# Patient Record
Sex: Female | Born: 1995 | Race: Black or African American | Hispanic: No | Marital: Single | Smoking: Never smoker
Health system: Southern US, Community
[De-identification: ages and names within clinical notes are randomized; demographics above are authoritative.]

## PROBLEM LIST (undated history)

## (undated) DIAGNOSIS — F259 Schizoaffective disorder, unspecified: Secondary | ICD-10-CM

---
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
## 2015-12-22 ENCOUNTER — Emergency Department (HOSPITAL_COMMUNITY)
Admission: EM | Admit: 2015-12-22 | Discharge: 2015-12-24 | Disposition: A | Discharge status: Other Acute Inpatient Hospital | Payer: MEDICAID | Attending: Emergency Medicine | Admitting: Emergency Medicine

## 2015-12-22 ENCOUNTER — Encounter (HOSPITAL_COMMUNITY): Payer: Self-pay | Admitting: Emergency Medicine

## 2015-12-22 DIAGNOSIS — F251 Schizoaffective disorder, depressive type: Secondary | ICD-10-CM | POA: Diagnosis present

## 2015-12-22 DIAGNOSIS — Z3202 Encounter for pregnancy test, result negative: Secondary | ICD-10-CM | POA: Insufficient documentation

## 2015-12-22 DIAGNOSIS — Z046 Encounter for general psychiatric examination, requested by authority: Secondary | ICD-10-CM | POA: Diagnosis present

## 2015-12-22 DIAGNOSIS — R45850 Homicidal ideations: Secondary | ICD-10-CM | POA: Diagnosis not present

## 2015-12-22 HISTORY — DX: Schizoaffective disorder, unspecified: F25.9

## 2015-12-22 LAB — COMPREHENSIVE METABOLIC PANEL
ALBUMIN: 3.6 g/dL (ref 3.5–5.0)
ALT: 23 U/L (ref 14–54)
AST: 18 U/L (ref 15–41)
Alkaline Phosphatase: 55 U/L (ref 38–126)
Anion gap: 8 (ref 5–15)
BUN: 9 mg/dL (ref 6–20)
CHLORIDE: 109 mmol/L (ref 101–111)
CO2: 25 mmol/L (ref 22–32)
CREATININE: 0.74 mg/dL (ref 0.44–1.00)
Calcium: 8.6 mg/dL — ABNORMAL LOW (ref 8.9–10.3)
GFR calc Af Amer: >60 mL/min (ref >60)
GLUCOSE: 123 mg/dL — AB (ref 65–99)
Potassium: 4 mmol/L (ref 3.5–5.1)
Sodium: 142 mmol/L (ref 135–145)
Total Bilirubin: 0.3 mg/dL (ref 0.3–1.2)
Total Protein: 6.9 g/dL (ref 6.5–8.1)

## 2015-12-22 LAB — CBC
HCT: 32.9 % — ABNORMAL LOW (ref 36.0–46.0)
HEMOGLOBIN: 10.3 g/dL — AB (ref 12.0–15.0)
MCH: 24.5 pg — AB (ref 26.0–34.0)
MCHC: 31.3 g/dL (ref 30.0–36.0)
MCV: 78.1 fL (ref 78.0–100.0)
Platelets: 253 10*3/uL (ref 150–400)
RBC: 4.21 MIL/uL (ref 3.87–5.11)
RDW: 16.7 % — ABNORMAL HIGH (ref 11.5–15.5)
WBC: 6.3 10*3/uL (ref 4.0–10.5)

## 2015-12-22 LAB — ETHANOL

## 2015-12-22 NOTE — ED Notes (Signed)
Pt states she had unprotected sex and now has 2 open sores in her genital area. She would like this assessed while she is here

## 2015-12-22 NOTE — ED Provider Notes (Signed)
CSN: [id number]     Arrival date & time 12/22/15  2137 History   First MD Initiated Contact with Patient 12/22/15 2246     Chief Complaint  Patient presents with  . Medical Clearance     The history is provided by the patient. No language interpreter was used.   Evelyn Alvarez is a [age] y.o. female who presents to the Emergency Department complaining of psych eval. She has a hx/o schizoaffective disorder and has been off of her medications for the last five days.  She is worried that she will hit people because she has an overwhelming urge to hip people.  She denies SI/HI.  She has poor family support.  Symptoms are moderate and constant in nature.  Past Medical History  Diagnosis Date  . Schizoaffective disorder (HCC)    History reviewed. No pertinent past surgical history. No family history on file. Social History  Substance Use Topics  . Smoking status: Never Smoker   . Smokeless tobacco: None  . Alcohol Use: No   OB History    No data available     Review of Systems  All other systems reviewed and are negative.     Allergies  Review of patient's allergies indicates no known allergies.  Home Medications   Prior to Admission medications   Not on File   BP 108/63 mmHg  Pulse 98  Temp(Src) 97.6 F (36.4 C) (Oral)  Resp 16  Ht 5\' 6"  (1.676 m)  Wt 230 lb (104.327 kg)  BMI 37.14 kg/m2  SpO2 99% Physical Exam  Constitutional: She is oriented to person, place, and time. She appears well-developed and well-nourished.  HENT:  Head: Normocephalic and atraumatic.  Cardiovascular: Normal rate and regular rhythm.   Pulmonary/Chest: Effort normal. No respiratory distress.  Musculoskeletal: Normal range of motion.  Neurological: She is alert and oriented to person, place, and time.  Skin: Skin is warm.  Psychiatric:  Flat affect  Nursing note and vitals reviewed.   ED Course  Procedures (including critical care time) Labs Review Labs Reviewed  CBC - Abnormal;  Notable for the following:    Hemoglobin 10.3 (*)    HCT 32.9 (*)    MCH 24.5 (*)    RDW 16.7 (*)    All other components within normal limits  COMPREHENSIVE METABOLIC PANEL - Abnormal; Notable for the following:    Glucose, Bld 123 (*)    Calcium 8.6 (*)    All other components within normal limits  ETHANOL  URINE RAPID DRUG SCREEN, HOSP PERFORMED  POC URINE PREG, ED    Imaging Review No results found. I have personally reviewed and evaluated these images and lab results as part of my medical decision-making.   EKG Interpretation None      MDM   Final diagnoses:  None    Patient with history of schizoaffective disorder presents to the emergency Department for feeling off due to being off of her medications. She states she [facility]. And has been in [place] previously. She is currently calm and appropriate in the emergency Department. Unable to access any prior psychiatric records. TTS consulted. She is medically clear.    Tilden FossaElizabeth Jezebelle Ledwell, MD 12/22/15 [phone]
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
## 2015-12-23 DIAGNOSIS — F251 Schizoaffective disorder, depressive type: Secondary | ICD-10-CM

## 2015-12-23 LAB — RAPID URINE DRUG SCREEN, HOSP PERFORMED
AMPHETAMINES: NOT DETECTED
BARBITURATES: NOT DETECTED
Benzodiazepines: NOT DETECTED
Cocaine: NOT DETECTED
OPIATES: NOT DETECTED
TETRAHYDROCANNABINOL: NOT DETECTED

## 2015-12-23 LAB — POC URINE PREG, ED: PREG TEST UR: NEGATIVE

## 2015-12-23 MED ORDER — HYDROXYZINE HCL 25 MG PO TABS
25.0000 mg | ORAL_TABLET | Freq: Two times a day (BID) | ORAL | Status: DC
  Administered 2015-12-23 – 2015-12-24 (×2): 25 mg via ORAL
  Filled 2015-12-23 (×2): qty 1

## 2015-12-23 MED ORDER — LURASIDONE HCL 40 MG PO TABS
40.0000 mg | ORAL_TABLET | Freq: Every day | ORAL | Status: DC
  Administered 2015-12-24: 40 mg via ORAL
  Filled 2015-12-23 (×2): qty 1

## 2015-12-23 MED ORDER — TRAZODONE HCL 100 MG PO TABS
100.0000 mg | ORAL_TABLET | Freq: Every day | ORAL | Status: DC
  Administered 2015-12-23: 100 mg via ORAL
  Filled 2015-12-23: qty 1

## 2015-12-23 NOTE — BH Assessment (Signed)
Writer faxed referrals to the following hospitals: [facility], [facility], [facility], [facility], [facility], [facility], [facility],  [facility], [facility].   

## 2015-12-23 NOTE — BH Assessment (Signed)
Assessment completed. Consulted Alberteen SamFran Hobson, NP who recommended inpatient treatment. TTS to seek placement. Informed Dr. Madilyn Hookees of the recommendation.

## 2015-12-23 NOTE — BH Assessment (Signed)
Dr. Jannifer FranklinAkintayo recommends speaking with patients aunt tomorrow for collateral information . Evelyn GrebeNatalie [phone]. Patient states that if she is discharged she is able to live with her aunt. This needs to be confirmed before patient is discharged and to obtain collateral information.   [name], LCSW Therapeutic Triage Specialist [facility] 12/23/2015 4:35 PM

## 2015-12-23 NOTE — Consult Note (Signed)
[place] Psychiatry Consult   Reason for Consult:  Paranoia Referring Physician:  EDP Patient Identification: Evelyn Alvarez MRN:  [id number] Principal Diagnosis: Schizoaffective disorder, depressive type [facility]) Diagnosis:   Patient Active Problem List   Diagnosis Date Noted  . Schizoaffective disorder, depressive type ([place]) [F25.1] 12/23/2015    Priority: High    Total Time spent with patient: 45 minutes  Subjective:   Evelyn Alvarez is a 20 y.o. female patient admitted with  Paranoia  HPI:  AA female, 20 years old was evaluated for Paranoia.  Patient reports that she always believed that somebody was after her.  She reports felling anxious and and nervious for no reasons.  She recently moved out of her mother's house and has been homeless because she want to build her Character  She also reports  That she moved out of the house because the relationship between her and her mother is poor.  Patient reports a diagnosis of Schizoaffective disorder and that she was on medications she did not think were effective.  She however ran out of her medications and have not been taking any.  She was recently hospitalized at [facility] health stabilization.  She denies feeling sad or depress and reports that her mother has anger problem.  She has not been sleeping or eating well since she left her mother's home.  She plans to move in with her Aunt after her treatment.  She denies SI/HI/AVH.  She is accepted for admission and we will be seeking placement at any hospital with available bed.  Past Psychiatric History:  Schizoaffective disorder, Depressed type  Risk to Self: Suicidal Ideation: No Suicidal Intent: No Is patient at risk for suicide?: No Suicidal Plan?: No Access to Means: No What has been your use of drugs/alcohol within the last 12 months?: Pt denies  How many times?: 0 Other Self Harm Risks: Pt denies  Triggers for Past Attempts: None known (No previous attempts  reported. ) Intentional Self Injurious Behavior: None Risk to Others: Homicidal Ideation: No Thoughts of Harm to Others: No Current Homicidal Intent: No Current Homicidal Plan: No Access to Homicidal Means: No Identified Victim: N/A History of harm to others?: No Assessment of Violence: None Noted Violent Behavior Description: No violent behaviors observed.  Does patient have access to weapons?: No Criminal Charges Pending?: No Does patient have a court date: No Prior Inpatient Therapy: Prior Inpatient Therapy: Yes Prior Therapy Dates: 2016, 2017 Prior Therapy Facilty/Provider(s): [facility], [facility], [facility]  Reason for Treatment: Schizoaffective Disorder  Prior Outpatient Therapy: Prior Outpatient Therapy: Yes Prior Therapy Dates: Current  Prior Therapy Facilty/Provider(s): [facility]  Reason for Treatment: Medication management  Does patient have an ACCT team?: No Does patient have Intensive In-House Services?  : No Does patient have [facility] services? : Yes Does patient have P4CC services?: No  Past Medical History:  Past Medical History  Diagnosis Date  . Schizoaffective disorder ([place])    History reviewed. No pertinent past surgical history. Family History: No family history on file.   Family Psychiatric  History:  Denies Social History:  History  Alcohol Use No     History  Drug Use Not on file    Social History   Social History  . Marital Status: Single    Spouse Name: N/A  . Number of Children: N/A  . Years of Education: N/A   Social History Main Topics  . Smoking status: Never Smoker   . Smokeless tobacco: None  .  Alcohol Use: No  . Drug Use: None  . Sexual Activity: Not Asked   Other Topics Concern  . None   Social History Narrative  . None   Additional Social History:    Allergies:  No Known Allergies  Labs:  Results for orders placed or performed during the hospital encounter of 12/22/15 (from the past 48 hour(s))  CBC     Status: Abnormal    Collection Time: 12/22/15 10:43 PM  Result Value Ref Range   WBC 6.3 4.0 - 10.5 K/uL   RBC 4.21 3.87 - 5.11 MIL/uL   Hemoglobin 10.3 (L) 12.0 - 15.0 g/dL   HCT 32.9 (L) 36.0 - 46.0 %   MCV 78.1 78.0 - 100.0 fL   MCH 24.5 (L) 26.0 - 34.0 pg   MCHC 31.3 30.0 - 36.0 g/dL   RDW 16.7 (H) 11.5 - 15.5 %   Platelets 253 150 - 400 K/uL  Ethanol (ETOH)     Status: None   Collection Time: 12/22/15 10:43 PM  Result Value Ref Range   Alcohol, Ethyl (B) <5 <5 mg/dL    Comment:        LOWEST DETECTABLE LIMIT FOR SERUM ALCOHOL IS 5 mg/dL FOR MEDICAL PURPOSES ONLY   Comprehensive metabolic panel     Status: Abnormal   Collection Time: 12/22/15 10:43 PM  Result Value Ref Range   Sodium 142 135 - 145 mmol/L   Potassium 4.0 3.5 - 5.1 mmol/L   Chloride 109 101 - 111 mmol/L   CO2 25 22 - 32 mmol/L   Glucose, Bld 123 (H) 65 - 99 mg/dL   BUN 9 6 - 20 mg/dL   Creatinine, Ser 0.74 0.44 - 1.00 mg/dL   Calcium 8.6 (L) 8.9 - 10.3 mg/dL   Total Protein 6.9 6.5 - 8.1 g/dL   Albumin 3.6 3.5 - 5.0 g/dL   AST 18 15 - 41 U/L   ALT 23 14 - 54 U/L   Alkaline Phosphatase 55 38 - 126 U/L   Total Bilirubin 0.3 0.3 - 1.2 mg/dL   GFR calc non Af Amer >60 >60 mL/min   GFR calc Af Amer >60 >60 mL/min    Comment: (NOTE) The eGFR has been calculated using the CKD EPI equation. This calculation has not been validated in all clinical situations. eGFR's persistently <60 mL/min signify possible Chronic Kidney Disease.    Anion gap 8 5 - 15  Urine rapid drug screen (hosp performed) (Not at [facility])     Status: None   Collection Time: 12/23/15  5:15 AM  Result Value Ref Range   Opiates NONE DETECTED NONE DETECTED   Cocaine NONE DETECTED NONE DETECTED   Benzodiazepines NONE DETECTED NONE DETECTED   Amphetamines NONE DETECTED NONE DETECTED   Tetrahydrocannabinol NONE DETECTED NONE DETECTED   Barbiturates NONE DETECTED NONE DETECTED    Comment:        DRUG SCREEN FOR MEDICAL PURPOSES ONLY.  IF CONFIRMATION IS  NEEDED FOR ANY PURPOSE, NOTIFY LAB WITHIN 5 DAYS.        LOWEST DETECTABLE LIMITS FOR URINE DRUG SCREEN Drug Class       Cutoff (ng/mL) Amphetamine      1000 Barbiturate      200 Benzodiazepine   150 Tricyclics       569 Opiates          300 Cocaine          300 THC  50   POC urine preg, ED     Status: None   Collection Time: 12/23/15  5:22 AM  Result Value Ref Range   Preg Test, Ur NEGATIVE NEGATIVE    Comment:        THE SENSITIVITY OF THIS METHODOLOGY IS >24 mIU/mL     Current Facility-Administered Medications  Medication Dose Route Frequency Provider Last Rate Last Dose  . hydrOXYzine (ATARAX/VISTARIL) tablet 25 mg  25 mg Oral BID PC Adina Puzzo, MD      . Derrill Memo ON 12/24/2015] lurasidone (LATUDA) tablet 40 mg  40 mg Oral Q breakfast Hermila Millis, MD      . traZODone (DESYREL) tablet 100 mg  100 mg Oral QHS Corena Pilgrim, MD       Current Outpatient Prescriptions  Medication Sig Dispense Refill  . benztropine (COGENTIN) 1 MG tablet Take 1 mg by mouth 2 (two) times daily.    . paliperidone (INVEGA) 6 MG 24 hr tablet Take 6 mg by mouth daily.    . Paliperidone Palmitate (INVEGA SUSTENNA) 117 MG/0.75ML SUSP Inject 117 mg into the muscle every 30 (thirty) days.      Musculoskeletal: Strength & Muscle Tone: within normal limits Gait & Station: normal Patient leans: N/A  Psychiatric Specialty Exam: Review of Systems  Constitutional: Negative.   HENT: Negative.   Eyes: Negative.   Respiratory: Negative.   Cardiovascular: Negative.   Gastrointestinal: Negative.   Genitourinary: Negative.   Musculoskeletal: Negative.   Skin: Negative.   Neurological: Negative.   Endo/Heme/Allergies: Negative.     Blood pressure 118/72, pulse 95, temperature 97.8 F (36.6 C), temperature source Oral, resp. rate 18, height 5' 6"  (1.676 m), weight 104.327 kg (230 lb), SpO2 100 %.Body mass index is 37.14 kg/(m^2).  General Appearance: Casual and Fairly Groomed   Engineer, water::  Good  Speech:  Clear and Coherent and Normal Rate  Volume:  Normal  Mood:  NA  Affect:  Congruent and Depressed  Thought Process:  Coherent, Goal Directed and Intact  Orientation:  Full (Time, Place, and Person)  Thought Content:  WDL  Suicidal Thoughts:  No  Homicidal Thoughts:  No  Memory:  Immediate;   Good Recent;   Good Remote;   Good  Judgement:  Poor  Insight:  Shallow  Psychomotor Activity:  Normal  Concentration:  Fair  Recall:  NA  Fund of Knowledge:Poor  Language: Good  Akathisia:  No  Handed:  Right  AIMS (if indicated):     Assets:  Desire for Improvement Housing  ADL's:  Intact  Cognition: WNL  Sleep:      Treatment Plan Summary: Daily contact with patient to assess and evaluate symptoms and progress in treatment and Medication management  Disposition:   Accepted for admission and we will be seeking placement at any facility with available bed.  We will offer Hydroxyzine 25 mg po every 6 hours as needed for anxiety.  Latuda 40 mg po with breakfast for mood control and Trazodone 100 mg po QHS for sleep.  Delfin Gant, NP   PMHNP-BC 12/23/2015 4:31 PM Patient seen face-to-face for psychiatric evaluation, chart reviewed and case discussed with the physician extender and developed treatment plan. Reviewed the information documented and agree with the treatment plan. Corena Pilgrim, MD

## 2015-12-24 ENCOUNTER — Encounter (HOSPITAL_COMMUNITY): Payer: Self-pay

## 2015-12-24 ENCOUNTER — Inpatient Hospital Stay (HOSPITAL_COMMUNITY)
Admission: AD | Admit: 2015-12-24 | Discharge: 2016-01-01 | DRG: 885 | Disposition: A | Discharge status: Home / Self Care | Payer: MEDICAID | Source: Intra-hospital | Attending: Psychiatry | Admitting: Psychiatry

## 2015-12-24 DIAGNOSIS — F251 Schizoaffective disorder, depressive type: Secondary | ICD-10-CM | POA: Diagnosis present

## 2015-12-24 DIAGNOSIS — R45850 Homicidal ideations: Secondary | ICD-10-CM | POA: Diagnosis not present

## 2015-12-24 DIAGNOSIS — E221 Hyperprolactinemia: Secondary | ICD-10-CM | POA: Clinically undetermined

## 2015-12-24 DIAGNOSIS — Z833 Family history of diabetes mellitus: Secondary | ICD-10-CM | POA: Diagnosis not present

## 2015-12-24 DIAGNOSIS — Z590 Homelessness: Secondary | ICD-10-CM | POA: Diagnosis not present

## 2015-12-24 DIAGNOSIS — G4700 Insomnia, unspecified: Secondary | ICD-10-CM | POA: Diagnosis present

## 2015-12-24 DIAGNOSIS — F411 Generalized anxiety disorder: Secondary | ICD-10-CM | POA: Diagnosis present

## 2015-12-24 DIAGNOSIS — F250 Schizoaffective disorder, bipolar type: Secondary | ICD-10-CM | POA: Diagnosis present

## 2015-12-24 MED ORDER — MENTHOL 3 MG MT LOZG: 1.0000 | LOZENGE | OROMUCOSAL | Status: DC | PRN

## 2015-12-24 MED ORDER — MAGNESIUM HYDROXIDE 400 MG/5ML PO SUSP
30.0000 mL | Freq: Every day | ORAL | Status: DC | PRN
  Administered 2015-12-25: 30 mL via ORAL
  Filled 2015-12-24: qty 30

## 2015-12-24 MED ORDER — MENTHOL 3 MG MT LOZG
1.0000 | LOZENGE | OROMUCOSAL | Status: DC | PRN
  Administered 2015-12-24: 3 mg via ORAL
  Filled 2015-12-24: qty 9

## 2015-12-24 MED ORDER — HYDROXYZINE HCL 25 MG PO TABS
25.0000 mg | ORAL_TABLET | Freq: Two times a day (BID) | ORAL | Status: DC
  Administered 2015-12-25: 25 mg via ORAL
  Filled 2015-12-24 (×7): qty 1

## 2015-12-24 MED ORDER — TRAZODONE HCL 100 MG PO TABS
100.0000 mg | ORAL_TABLET | Freq: Every day | ORAL | Status: DC
  Administered 2015-12-25 – 2015-12-26 (×2): 100 mg via ORAL
  Filled 2015-12-24 (×5): qty 1

## 2015-12-24 MED ORDER — ACETAMINOPHEN 325 MG PO TABS: 650.0000 mg | ORAL_TABLET | Freq: Four times a day (QID) | ORAL | Status: DC | PRN

## 2015-12-24 MED ORDER — ALUM & MAG HYDROXIDE-SIMETH 200-200-20 MG/5ML PO SUSP: 30.0000 mL | ORAL | Status: DC | PRN

## 2015-12-24 MED ORDER — LURASIDONE HCL 40 MG PO TABS
40.0000 mg | ORAL_TABLET | Freq: Every day | ORAL | Status: DC
  Administered 2015-12-25: 40 mg via ORAL
  Filled 2015-12-24 (×4): qty 1

## 2015-12-24 NOTE — Progress Notes (Signed)
CSW was notified by Nurse that patient would like to speak with CSW.  CSW met with patient at bedside. Patient was alert and oriented. Patient informed CSW that she is homeless, and has been staying at [facility]. Patient states she is interested in finding a new place to live.  Patient is aware that she will be transferred to [facility]. CSW provided patient with community resources which consisted of a list of homeless shelters and food pantries. Patient informed CSW that she receives food stamps to assist her with food resources.  Patient states that it is an option for her to live with dad upon discharge. Patient states that her dad lives in [place] and that he is her primary support.   Willette Brace [phone] ED CSW 12/24/2015 5:17 PM

## 2015-12-24 NOTE — Tx Team (Signed)
Initial Interdisciplinary Treatment Plan   PATIENT STRESSORS: Medication change or noncompliance Homeless   PATIENT STRENGTHS: Communication skills Supportive family/friends   PROBLEM LIST: Problem List/Patient Goals Date to be addressed Date deferred Reason deferred Estimated date of resolution  Psychosis-Negative thoughts 12/24/15     Anxiety 12/24/15     "Learn coping skills for PTSD and negative thinking" 12/24/15     "I want to e able to remind myself that I'm not in danger" 12/24/15                                    DISCHARGE CRITERIA:  Need for constant or close observation no longer present Verbal commitment to aftercare and medication compliance  PRELIMINARY DISCHARGE PLAN: Outpatient therapy Medication management  PATIENT/FAMIILY INVOLVEMENT: This treatment plan has been presented to and reviewed with the patient, Evelyn Alvarez.  The patient and family have been given the opportunity to ask questions and make suggestions.  Norm ParcelHeather V Dale Ribeiro 12/24/2015, 10:45 PM

## 2015-12-24 NOTE — ED Notes (Addendum)
Pt is very cooperative but requests food all the time. Pt denies SI and HI and contracts for safety. Pt has been requesting snacks all day long. (12noon ) 3pm Pt is asleep but easily awakens. Pt remains pleasant and cooperative. Report to Jan , Charity fundraiserN. [facility]. Report to the new shift. (3:20pm )

## 2015-12-24 NOTE — Progress Notes (Signed)
Did not attend group 

## 2015-12-24 NOTE — Progress Notes (Addendum)
Evelyn Alvarez is a 20 year old female admitted to room 503-1 from [facility] due to complaints of "negative thoughts and PTSD."  She was recently d/c from [place] a few days ago but didn't start her medications when she was discharged.  She is currently homeless and she isn't sure where she is going to go upon discharge.  She denies SI/HI or A/V hallucinations at this time.  Admission paperwork completed and signed.  Skin assessment completed and no issues noted.  Belonging searched and secured in locker # 40-brown purse, blue coat, gray shoes, plastic bag with papers and black notebook.  Oriented her to the unit.  Q 15 minute checks initiated for safety.  We will monitor the progress towards her goals.

## 2015-12-24 NOTE — ED Notes (Signed)
Pt is awake and alert and just took a shower on the unit. Clean scrubs provided per pt request.  She is exhibiting no signs of distress and is currently on the phone with a family member.

## 2015-12-24 NOTE — BH Assessment (Signed)
[facility] Assessment Progress Note  Per Thedore MinsMojeed Akintayo, MD, this pt requires psychiatric hospitalization at this time.  Evelyn Heinrichina Tate, RN, [facility].  Pt has signed Voluntary Admission and Consent for Treatment, as well as Consent to Release Information to [facility], her outpatient provider, and a notification call has been placed.  Signed forms [facility].  Pt's nurse, Marylu LundJanet, has been notified, and agrees to send original paperwork along with pt via [place] [place], and to call report to [phone].  Doylene Canninghomas Ambur Province, MA Triage Specialist [phone]

## 2015-12-24 NOTE — Progress Notes (Addendum)
Reviewed EPIC medicaid response hx  [facility] to confirm pt has not been seen in this office but the office is accepting new patients  [place]   Entered in d/c instructions  [facility] Schedule an appointment as soon as possible for a visit This is your assigned medicaid doctor PLEASE call to establish care at this office They confirmed they are accepting new patients pcp is listed as Sandi Mariscalmedicaid [place] access provider [facility] [address] [place] [phone]  Medicaid [place] access patient You may also benefit from [place] transportation to your medical appointments [phone] If you plan to reside in [place]- please to DSS [address] [place] or call [phone] to updated your medicaid provider to a [place] provider
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
## 2015-12-24 NOTE — ED Notes (Addendum)
Pt woke up requesting supplies to clean herself up with since she was on her period. Pt also requesting cough drops.

## 2015-12-25 ENCOUNTER — Encounter (HOSPITAL_COMMUNITY): Payer: Self-pay | Admitting: Psychiatry

## 2015-12-25 DIAGNOSIS — F250 Schizoaffective disorder, bipolar type: Principal | ICD-10-CM

## 2015-12-25 DIAGNOSIS — R45850 Homicidal ideations: Secondary | ICD-10-CM

## 2015-12-25 MED ORDER — OLANZAPINE 10 MG PO TBDP: 10.0000 mg | ORAL_TABLET | Freq: Three times a day (TID) | ORAL | Status: DC | PRN

## 2015-12-25 MED ORDER — HYDROXYZINE HCL 25 MG PO TABS
25.0000 mg | ORAL_TABLET | Freq: Three times a day (TID) | ORAL | Status: DC | PRN
  Administered 2015-12-25 – 2015-12-28 (×2): 25 mg via ORAL
  Filled 2015-12-25 (×3): qty 1

## 2015-12-25 MED ORDER — OLANZAPINE 10 MG IM SOLR: 10.0000 mg | Freq: Three times a day (TID) | INTRAMUSCULAR | Status: DC | PRN

## 2015-12-25 NOTE — BHH Group Notes (Signed)
[facility] Group Notes:  (Nursing/MHT/Case Management/Adjunct)  Date:  12/25/2015  Time:  3:08 PM  Type of Therapy:  Psychoeducational Skills  Participation Level:  Active  Participation Quality:  Appropriate  Affect:  Appropriate  Cognitive:  Alert  Insight:  Appropriate  Engagement in Group:  Engaged and Supportive  Modes of Intervention:  Discussion    Summary of Progress/Problems:   Group was about coping skills and when pts. Have used them in the past to deal with situations.  Pt. Told about times when other people had made her angry and what coping skills she used to help deal with her anger.  Pt. Shows good participation and insight  Arsenio LoaderHiatt, Espiridion Supinski Dudley 12/25/2015, 3:08 PM

## 2015-12-25 NOTE — Progress Notes (Signed)
Adult Psychoeducational Group Note  Date:  12/25/2015 Time: 09:15am  Group Topic/Focus:  Recovery Goals:   The focus of this group is to identify appropriate goals for recovery and establish a plan to achieve them.  Participation Level:  Active  Participation Quality:  Appropriate  Affect:  Flat  Cognitive:  Alert and Disorganized  Insight: Improving  Engagement in Group:  Engaged and Improving  Modes of Intervention:  Activity, Discussion, Education and Support  Additional Comments:  Pt attended and actively participated in group. Pt reports that she would like to incorporate deep breathing as a coping skill into her recovery plan.  Aurora Maskwyman, Vihan Santagata E 12/25/2015, 11:43 AM

## 2015-12-25 NOTE — BHH Suicide Risk Assessment (Signed)
[facility] Admission Suicide Risk Assessment   Nursing information obtained from:    Demographic factors:    Current Mental Status:    Loss Factors:    Historical Factors:    Risk Reduction Factors:     Total Time spent with patient: 30 minutes Principal Problem: Schizoaffective disorder, bipolar type (HCC) Diagnosis:   Patient Active Problem List   Diagnosis Date Noted  . Schizoaffective disorder, bipolar type (HCC) [F25.0] 12/25/2015   Subjective Data: Patient states " I was at this program in [place] and I stopped talking to people , I also felt paranoid and so they send me to another hospital last week.I continue to feel paranoid and need to learn some coping techniques."    Continued Clinical Symptoms:  Alcohol Use Disorder Identification Test Final Score (AUDIT): 0 The "Alcohol Use Disorders Identification Test", Guidelines for Use in Primary Care, Second Edition.  World Science writer [facility]). Score between 0-7:  no or low risk or alcohol related problems. Score between 8-15:  moderate risk of alcohol related problems. Score between 16-19:  high risk of alcohol related problems. Score 20 or above:  warrants further diagnostic evaluation for alcohol dependence and treatment.   CLINICAL FACTORS:   Previous Psychiatric Diagnoses and Treatments   Musculoskeletal: Strength & Muscle Tone: within normal limits Gait & Station: normal Patient leans: N/A  Psychiatric Specialty Exam: Review of Systems  Psychiatric/Behavioral: The patient is nervous/anxious.   All other systems reviewed and are negative.   Blood pressure 120/53, pulse 103, temperature 98 F (36.7 C), temperature source Oral, resp. rate 20, height  (1.676 m), weight 114.08 kg (251 lb 8 oz), SpO2 100 %.Body mass index is 40.61 kg/(m^2).  General Appearance: Fairly Groomed  Patent attorney::  Fair  Speech:  Normal Rate  Volume:  Normal  Mood:  Anxious  Affect:  Congruent  Thought Process:  Goal Directed   Orientation:  Full (Time, Place, and Person)  Thought Content:  Paranoid Ideation and Rumination  Suicidal Thoughts:  No  Homicidal Thoughts:  No  Memory:  Immediate;   Fair Recent;   Fair Remote;   Fair  Judgement:  Impaired  Insight:  Shallow  Psychomotor Activity:  Normal  Concentration:  Fair  Recall:  [employer] of Knowledge:Fair  Language: Fair  Akathisia:  No  Handed:  Right  AIMS (if indicated):     Assets:  Desire for Improvement  Sleep:  Number of Hours: 6.75  Cognition: WNL  ADL's:  Intact    COGNITIVE FEATURES THAT CONTRIBUTE TO RISK:  Closed-mindedness, Polarized thinking and Thought constriction (tunnel vision)    SUICIDE RISK:   Moderate:  Frequent suicidal ideation with limited intensity, and duration, some specificity in terms of plans, no associated intent, good self-control, limited dysphoria/symptomatology, some risk factors present, and identifiable protective factors, including available and accessible social support.  PLAN OF CARE: Patient will benefit from inpatient treatment and stabilization.  Estimated length of stay is 5-7 days.  Reviewed past medical records,treatment plan.  Will continue Latuda 40 mg po daily for mood sx/psychosis. Will make available PRN medications as per agitation protocol. Will continue to monitor vitals ,medication compliance and treatment side effects while patient is here.  Will monitor for medical issues as well as call consult as needed.  Reviewed labs , cbc - low hb/hct - likely chronic , UDS- negative , BAL <5 , ekg - for qtc , will order tsh, lipid panel, hba1c, PL. CSW will start working  on disposition.  Patient to participate in therapeutic milieu .       I certify that inpatient services furnished can reasonably be expected to improve the patient's condition.   Jalacia Mattila, MD 12/25/2015, 1:55 PM
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
## 2015-12-25 NOTE — Progress Notes (Signed)
Pt at the time of assessment was in bed with eye closed Pt did not look to be in any distress. Support, encouragement, and safe environment provided.  15-minute safety checks continue. Meds not given as Pt was very difficult to arouse.

## 2015-12-25 NOTE — [facility] Group Notes (Signed)
[facility] LCSW Group Therapy  12/25/2015 , 1:34 PM   Type of Therapy:  Group Therapy  Participation Level:  Active  Participation Quality:  Attentive  Affect:  Appropriate  Cognitive:  Alert  Insight:  Improving  Engagement in Therapy:  Engaged  Modes of Intervention:  Discussion, Exploration and Socialization  Summary of Progress/Problems: Today's group focused on the term Diagnosis.  Participants were asked to define the term, and then pronounce whether it is a negative, positive or neutral term.  In and out a couple of times.  Reluctant group member.  "I'm afraid that I will say something that sounds stupid."  Others encouraged her, and promised no judgement.  This seemed to help.  "Mistakes=Protein."  Went on to explain that We need food for protein so that we can grow, just like we need mistakes for protein "so we can Alvarez from them."  Daryel GeraldNorth, Evelyn Alvarez B 12/25/2015 , 1:34 PM

## 2015-12-25 NOTE — Progress Notes (Signed)
Patient ID: Evelyn Alvarez, female   DOB: Dec 23, 1995, 20 y.o.   MRN: [id number]  Pt currently presents with a flat affect and guarded, anxious behavior. Per self inventory, pt rates depression at a 0, hopelessness 0 and anxiety 2. Pt's daily goal is to "my negative thoughts" and they intend to do so by "breath slowly." Pt reports good sleep, a good appetite, normal energy and good concentration. Pt reports that she does not want to "take too many medications" and asks writer to review them with her. Pt seen practicing yoga in the hallway with another pt.   Pt provided with medications per providers orders. Pt's labs and vitals were monitored throughout the day. Pt supported emotionally and encouraged to express concerns and questions. Pt educated on medications and the importance of adhering to medication regimen.   Pt's safety ensured with 15 minute and environmental checks. Pt currently experiencing some thought blocking, has trouble conveying what she is thinking with others. Pt currently denies SI/HI and A/V hallucinations. Pt verbally agrees to seek staff if SI/HI or A/VH occurs and to consult with staff before acting on these thoughts. Will continue POC.
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
## 2015-12-26 LAB — LIPID PANEL
CHOL/HDL RATIO: 3.2 ratio
CHOLESTEROL: 159 mg/dL (ref 0–200)
HDL: 50 mg/dL (ref >40)
LDL Cholesterol: 95 mg/dL (ref 0–99)
TRIGLYCERIDES: 68 mg/dL (ref <150)
VLDL: 14 mg/dL (ref 0–40)

## 2015-12-26 LAB — TSH: TSH: 2.596 u[IU]/mL (ref 0.350–4.500)

## 2015-12-26 MED ORDER — BENZTROPINE MESYLATE 0.5 MG PO TABS
0.5000 mg | ORAL_TABLET | Freq: Every day | ORAL | Status: DC
  Administered 2015-12-29 – 2015-12-31 (×3): 0.5 mg via ORAL
  Filled 2015-12-26 (×7): qty 1

## 2015-12-26 MED ORDER — PALIPERIDONE ER 3 MG PO TB24
3.0000 mg | ORAL_TABLET | Freq: Every day | ORAL | Status: DC
  Filled 2015-12-26: qty 1

## 2015-12-26 MED ORDER — PALIPERIDONE ER 3 MG PO TB24: 3.0000 mg | ORAL_TABLET | ORAL | Status: DC

## 2015-12-26 MED ORDER — PALIPERIDONE ER 6 MG PO TB24
6.0000 mg | ORAL_TABLET | Freq: Every day | ORAL | Status: DC
  Filled 2015-12-26 (×2): qty 1

## 2015-12-26 MED ORDER — PALIPERIDONE ER 3 MG PO TB24
3.0000 mg | ORAL_TABLET | Freq: Every day | ORAL | Status: DC
  Administered 2015-12-26: 3 mg via ORAL
  Filled 2015-12-26 (×3): qty 1

## 2015-12-26 MED ORDER — PALIPERIDONE PALMITATE 156 MG/ML IM SUSP: 117.0000 mg | INTRAMUSCULAR | Status: DC

## 2015-12-26 NOTE — Progress Notes (Signed)
D. Pt had been up and visible in milieu this evening, seen interacting appropriately with peers in milieu. Pt spoke about her day and pt spoke about how she has been feeling paranoid. Pt reports that she feels her medications help with this but spoke about needing to find ways to not feel so paranoid. Pt was able to receive all medications without incident this evening and did not verbalize any complaints of pain. A. Support and encouragement provided, medication education given. R. Pt verbalized understanding, safety maintained.

## 2015-12-26 NOTE — [facility] Group Notes (Signed)
[facility] LCSW Group Therapy  12/26/2015 1:31 PM  Type of Therapy: Group Therapy  Participation Level: Active  Participation Quality: Attentive  Affect: Flat  Cognitive: Oriented  Insight: Limited  Engagement in Therapy: Engaged  Modes of Intervention: Discussion and Socialization  Summary of Progress/Problems: Evelyn Alvarez from [facility] [employer] was here to tell his story of recovery and play his guitar.  Pt was present and alert for the duration of group. Seemed to be responding to internal stimuli as evidenced by smiling to herself and thought blocking. Mentioned to speaker that he wasn't giving enough eye contact and that was making her feel excluded "zoned out" from the group. Later left group because she continued to feel as if the speaker wasn't giving enough eye contact.  Evelyn Alvarez 12/26/2015 1:31 PM

## 2015-12-26 NOTE — Progress Notes (Signed)
DAR NOTE: Patient is calm and pleasant.  Mood and affect is bright.  Denies pain, auditory and visual hallucinations.  Rates depression at 0, hopelessness at 0, and anxiety at 2.  Maintained on routine safety checks.  Medications given as prescribed.  Support and encouragement offered as needed.  Attended group and participated.  States goal for today is "compassion."  Patient observed socializing with peers in the dayroom.  Offered no complaint.

## 2015-12-26 NOTE — Progress Notes (Signed)
Patient ID: Evelyn Alvarez, female   DOB: September 02, 1996, 20 y.o.   MRN: [id number] D: Client visible on the unit, in dayroom watching TV. Client reports "really good day" "everything been well" "learn self-control" "breathing" "saying I'm in control" A: Writer provided emotional support encouraged client to continue to practice coping skills. Medications reviewed and administered as ordered. Staff will monitor q1815min for safety. R: client is safe on the unit, attended group.

## 2015-12-26 NOTE — Progress Notes (Addendum)
[facility] MD Progress Note  12/26/2015 1:28 PM Evelyn Alvarez  MRN:  [id number] Subjective: Patient states " I feel less anxious today.'  Objective:Quantina Cadden is a 20 y.o. AA female, single , unemployed , has a  hx of schizoaffective disorder ,  patient admitted with paranoia and agitation . Patient seen and chart reviewed.Discussed patient with treatment team.  Pt today continues to have thought blocking as well as paranoia. Per staff patient continues to discuss topics with delusional content. Pt seen as vaguely irritable at times. Pt has been trying to cope with anxiety by doing yoga in the hallway. Pt has been refusing the Latuda, reports she does not feel good on it. Pt per EHR was on Invega sustenna IM - she reports that she received 2 does of Invega, the week of March 12 th.  CSW to obtain medical records from [facility] . Pt agrees to be restarted on Invega PO until we can review her records.   Principal Problem: Schizoaffective disorder, bipolar type (HCC) Diagnosis:   Patient Active Problem List   Diagnosis Date Noted  . Schizoaffective disorder, bipolar type (HCC) [F25.0] 12/25/2015   Total Time spent with patient: 30 minutes  Past Psychiatric History: please see H&p  Past Medical History: Denies hx of HTN, thyroid disease,asthma Past Medical History  Diagnosis Date  . Schizoaffective disorder (HCC)    History reviewed. No pertinent past surgical history. Family History:  Family History  Problem Relation Age of Onset  . Mental illness Neg Hx   . Diabetes Maternal Grandmother   . Alcoholism Maternal Grandmother    Family Psychiatric  History: Pt reports her mother has anger management issues. Her grand mother used to be an alcoholic. Social History: Pt is single , was living at a program at [place] , but currently reports she is homeless.  History  Alcohol Use No     History  Drug Use Not on file    Social History   Social History  . Marital Status: Single     Spouse Name: N/A  . Number of Children: N/A  . Years of Education: N/A   Social History Main Topics  . Smoking status: Never Smoker   . Smokeless tobacco: None  . Alcohol Use: No  . Drug Use: None  . Sexual Activity: Not Asked   Other Topics Concern  . None   Social History Narrative   Additional Social History:                         Sleep: Fair  Appetite:  Fair  Current Medications: Current Facility-Administered Medications  Medication Dose Route Frequency Provider Last Rate Last Dose  . acetaminophen (TYLENOL) tablet 650 mg  650 mg Oral Q6H PRN Charm Rings, NP      . alum & mag hydroxide-simeth (MAALOX/MYLANTA) 200-200-20 MG/5ML suspension 30 mL  30 mL Oral Q4H PRN Charm Rings, NP      . hydrOXYzine (ATARAX/VISTARIL) tablet 25 mg  25 mg Oral TID PRN Jomarie Longs, MD   25 mg at 12/25/15 2045  . magnesium hydroxide (MILK OF MAGNESIA) suspension 30 mL  30 mL Oral Daily PRN Charm Rings, NP   30 mL at 12/25/15 1048  . menthol-cetylpyridinium (CEPACOL) lozenge 3 mg  1 lozenge Oral PRN Charm Rings, NP      . OLANZapine zydis (ZYPREXA) disintegrating tablet 10 mg  10 mg Oral TID PRN Jomarie Longs, MD  Or  . OLANZapine (ZYPREXA) injection 10 mg  10 mg Intramuscular TID PRN Jomarie Longs, MD      . Melene Muller ON 12/27/2015] paliperidone (INVEGA) 24 hr tablet 3 mg  3 mg Oral QHS Verna Desrocher, MD      . traZODone (DESYREL) tablet 100 mg  100 mg Oral QHS Charm Rings, NP   100 mg at 12/25/15 2045    Lab Results:  Results for orders placed or performed during the hospital encounter of 12/24/15 (from the past 48 hour(s))  TSH     Status: None   Collection Time: 12/26/15  6:20 AM  Result Value Ref Range   TSH 2.596 0.350 - 4.500 uIU/mL    Comment: Performed at [facility]  Lipid panel     Status: None   Collection Time: 12/26/15  6:20 AM  Result Value Ref Range   Cholesterol 159 0 - 200 mg/dL   Triglycerides 68 <161 mg/dL    HDL 50 >09 mg/dL   Total CHOL/HDL Ratio 3.2 RATIO   VLDL 14 0 - 40 mg/dL   LDL Cholesterol 95 0 - 99 mg/dL    Comment:        Total Cholesterol/HDL:CHD Risk Coronary Heart Disease Risk Table                     Men   Women  1/2 Average Risk   3.4   3.3  Average Risk       5.0   4.4  2 X Average Risk   9.6   7.1  3 X Average Risk  23.4   11.0        Use the calculated Patient Ratio above and the CHD Risk Table to determine the patient's CHD Risk.        ATP III CLASSIFICATION (LDL):  <100     mg/dL   Optimal  604-540  mg/dL   Near or Above                    Optimal  130-159  mg/dL   Borderline  981-191  mg/dL   High  >478     mg/dL   Very High Performed at [facility]     Blood Alcohol level:  Lab Results  Component Value Date   [facility] <5 12/22/2015    Physical Findings: AIMS:  , ,  ,  ,    CIWA:    COWS:     Musculoskeletal: Strength & Muscle Tone: within normal limits Gait & Station: normal Patient leans: N/A  Psychiatric Specialty Exam: Review of Systems  Psychiatric/Behavioral: The patient is nervous/anxious.   All other systems reviewed and are negative.   Blood pressure 108/76, pulse 103, temperature 97.8 F (36.6 C), temperature source Oral, resp. rate 18, height  (1.676 m), weight 114.08 kg (251 lb 8 oz), SpO2 100 %.Body mass index is 40.61 kg/(m^2).  General Appearance: Fairly Groomed  Patent attorney::  Fair  Speech:  Slow  Volume:  Normal  Mood:  Anxious  Affect:  Labile  Thought Process:  Irrelevant  Orientation:  Full (Time, Place, and Person)  Thought Content:  Delusions, Paranoid Ideation and Rumination  Suicidal Thoughts:  No is paranoid that makes her a danger to self or others. Pt also with hx of aggression 2/2 paranoia  Homicidal Thoughts:  No  Memory:  Immediate;   Fair Recent;   Fair Remote;   Fair  Judgement:  Impaired  Insight:  Shallow  Psychomotor Activity:  Restlessness  Concentration:  Poor  Recall:  Fair  Fund of  Knowledge:Fair  Language: Fair  Akathisia:  No  Handed:  Right  AIMS (if indicated):     Assets:  Desire for Improvement  ADL's:  Intact  Cognition: WNL  Sleep:  Number of Hours: 6.5   Treatment Plan Summary:Evelyn Alvarez is a 20 y.o. AA female, single , unemployed , has a  hx of schizoaffective disorder ,  patient admitted with paranoia and agitation. Pt today continues to have some thought blocking as well as is delusional. Will continue treatment.  Daily contact with patient to assess and evaluate symptoms and progress in treatment and Medication management  Will discontinue Latuda since patient reports it makes her feel bad and has refused the AM dose. Pt per EHR was on Invega sustenna - unknown when her last dose was. Will obtain medical records from [facility] . Will start Invega ER 3 mg po qhs for psychosis/mood sx, will await records. Will make available PRN medications as per agitation protocol. Will continue to monitor vitals ,medication compliance and treatment side effects while patient is here.  Will monitor for medical issues as well as call consult as needed.  Reviewed labs  ekg - for qtc wnl ,  tsh- wnl , lipid panel- wnl ,pending  hba1c, PL. CSW will continue working on disposition.  Patient to participate in therapeutic milieu .  Alton Bouknight, MD 12/26/2015, 1:28 PM   Addendum 12/26/15 - 2:23 PM  Reviewed records from [facility] - Pt presented as catatonic , disorganized on Nov 18, 2015 and was discharged on march 14 , 2017. Pt was on Forced medication at [facility] and she received Invega sustenna IM 234 mg on March 2 , 2017,and 156 mg on March 9 the 2017. She is due for Invega sustenna IM 117 mg on march 31 st . Pt was advised to continue Invega er 6 mg po daily for 15 days after discharge.   Jomarie LongsSaramma Previn Jian ,MD [facility]

## 2015-12-27 DIAGNOSIS — E221 Hyperprolactinemia: Secondary | ICD-10-CM | POA: Clinically undetermined

## 2015-12-27 LAB — HEMOGLOBIN A1C
Hgb A1c MFr Bld: 6.1 % — ABNORMAL HIGH (ref 4.8–5.6)
MEAN PLASMA GLUCOSE: 128 mg/dL

## 2015-12-27 LAB — PROLACTIN: Prolactin: 84.1 ng/mL — ABNORMAL HIGH (ref 4.8–23.3)

## 2015-12-27 MED ORDER — ARIPIPRAZOLE 5 MG PO TABS
5.0000 mg | ORAL_TABLET | Freq: Every day | ORAL | Status: DC
  Administered 2015-12-29 – 2015-12-31 (×3): 5 mg via ORAL
  Filled 2015-12-27 (×7): qty 1

## 2015-12-27 MED ORDER — PALIPERIDONE ER 6 MG PO TB24
6.0000 mg | ORAL_TABLET | Freq: Every day | ORAL | Status: DC
  Filled 2015-12-27: qty 1

## 2015-12-27 MED ORDER — TRAZODONE HCL 50 MG PO TABS
50.0000 mg | ORAL_TABLET | Freq: Every evening | ORAL | Status: DC | PRN
  Administered 2015-12-30: 50 mg via ORAL
  Filled 2015-12-27: qty 1

## 2015-12-27 NOTE — [facility] Group Notes (Signed)
[facility] Group Notes:  (Nursing/MHT/Case Management/Adjunct)  Date:  12/27/2015  Time:  3:10 PM  Type of Therapy:  Nurse Education  Participation Level:  Did Not Attend    Mickie Baillizabeth O Iwenekha 12/27/2015, 3:10 PM

## 2015-12-27 NOTE — Progress Notes (Signed)
Recreation Therapy Notes  03.23.2017 approximately 2:30pm Patient reports prior to admission she was going to [facility], but did not feel safe so she called the cops and they brought her to the hospital. Patient failed to identify stressors during admission, avoiding answering question when asked. Patient did report she is currently homeless and feels this is best for her right now. Patient unable to provide logical answer for why homelessness is best option for her, acknowledging that she does not have access to a shower, clean water, food, a place to wash her cloths or shelter. Despite patient admissions she continues to endorse that she prefers homelessness and it is best for her, stating homelessness provides her with a calm environment. Additionally patient identified that homelessness gives her a new appreciation for herself because she is building herself up. Patient did report she was homeless for approximately 1 week, following [facility]. Patient reports she uses mindfulness, journaling and "self-control" as coping skills. Patient reports intentions on entering [facility] program post d/c to eventually get placed in a home.   Due to patient admitted familiarity with mindfulness LRT worked on technique with patient, educating her on proper technique for deep breathing. Patient responsive and asked appropriate questions for clarification. Patient demonstrated ability to practice independently post d/c.   Marykay Lexenise L Aadit Hagood, LRT/CTRS   Jearl KlinefelterBlanchfield, Treyvonne Tata L 12/27/2015 3:45 PM

## 2015-12-27 NOTE — Progress Notes (Signed)
D: Pt presents with anxious affect and mood. Stated that she's worried about her "genital herpes situation". Cooperative with care and routines. Denies SI, HI, AVH and pain when assessed. Mild confusion noted with repeated questions related to medications and blood work. Visible in milieu at intervals during shift. Off unit for meals with peers, returned without issues.  A: Emotional support, availability and encouragement provided to pt. Urine sample obtained for GC and Chlamyidia. Q 15 minutes checks maintained for safety. R: Pt remains safe on and off unit.

## 2015-12-27 NOTE — Progress Notes (Signed)
[facility] MD Progress Note  12/27/2015 2:43 PM Natalija Mavis  MRN:  [id number] Subjective: Patient states " I feel all right. I am not blocking.'    Objective:Ahri Audia is a 20 y.o. AA female, single , unemployed , has a  hx of schizoaffective disorder ,  patient admitted with paranoia and agitation . Patient seen and chart reviewed.Discussed patient with treatment team.  Pt today continues to have some  thought blocking and is observed as vaguely irritable , paranoid at times. Discussed with patient about adding another medications to her Invega sustenna IM . Pt initially refused and became more irritable . However , later on writer approached her about her elevated PL level which is likely secondary to being on [place] . Discussed with pt that adding another medication like Abilify will help to reduce it. Pt agreed with same at that time . Per staff - pt continues to be bizarre  paranoid and continues to need support.    Principal Problem: Schizoaffective disorder, bipolar type (HCC) Diagnosis:   Patient Active Problem List   Diagnosis Date Noted  . Hyperprolactinemia (HCC) [E22.1] 12/27/2015  . Schizoaffective disorder, bipolar type (HCC) [F25.0] 12/25/2015   Total Time spent with patient: 30 minutes  Past Psychiatric History: please see H&p  Past Medical History: Denies hx of HTN, thyroid disease,asthma Past Medical History  Diagnosis Date  . Schizoaffective disorder (HCC)    History reviewed. No pertinent past surgical history. Family History:  Family History  Problem Relation Age of Onset  . Mental illness Neg Hx   . Diabetes Maternal Grandmother   . Alcoholism Maternal Grandmother    Family Psychiatric  History: Pt reports her mother has anger management issues. Her grand mother used to be an alcoholic. Social History: Pt is single , was living at a program at [place] , but currently reports she is homeless.  History  Alcohol Use No     History  Drug Use Not on  file    Social History   Social History  . Marital Status: Single    Spouse Name: N/A  . Number of Children: N/A  . Years of Education: N/A   Social History Main Topics  . Smoking status: Never Smoker   . Smokeless tobacco: None  . Alcohol Use: No  . Drug Use: None  . Sexual Activity: Not Asked   Other Topics Concern  . None   Social History Narrative   Additional Social History:                         Sleep: Fair  Appetite:  Fair  Current Medications: Current Facility-Administered Medications  Medication Dose Route Frequency Provider Last Rate Last Dose  . acetaminophen (TYLENOL) tablet 650 mg  650 mg Oral Q6H PRN Charm Rings, NP      . alum & mag hydroxide-simeth (MAALOX/MYLANTA) 200-200-20 MG/5ML suspension 30 mL  30 mL Oral Q4H PRN Charm Rings, NP      . ARIPiprazole (ABILIFY) tablet 5 mg  5 mg Oral QHS Erubiel Manasco, MD      . benztropine (COGENTIN) tablet 0.5 mg  0.5 mg Oral QHS Teralyn Mullins, MD      . hydrOXYzine (ATARAX/VISTARIL) tablet 25 mg  25 mg Oral TID PRN Jomarie Longs, MD   25 mg at 12/25/15 2045  . magnesium hydroxide (MILK OF MAGNESIA) suspension 30 mL  30 mL Oral Daily PRN Charm Rings, NP   30  mL at 12/25/15 1048  . menthol-cetylpyridinium (CEPACOL) lozenge 3 mg  1 lozenge Oral PRN Charm Rings, NP      . OLANZapine zydis (ZYPREXA) disintegrating tablet 10 mg  10 mg Oral TID PRN Jomarie Longs, MD       Or  . OLANZapine (ZYPREXA) injection 10 mg  10 mg Intramuscular TID PRN Jomarie Longs, MD      . Melene Muller ON 01/04/2016] paliperidone (INVEGA SUSTENNA) injection 117 mg  117 mg Intramuscular Q28 days Jomarie Longs, MD      . traZODone (DESYREL) tablet 50 mg  50 mg Oral QHS PRN Jomarie Longs, MD        Lab Results:  Results for orders placed or performed during the hospital encounter of 12/24/15 (from the past 48 hour(s))  TSH     Status: None   Collection Time: 12/26/15  6:20 AM  Result Value Ref Range   TSH 2.596 0.350 -  4.500 uIU/mL    Comment: Performed at [facility]  Lipid panel     Status: None   Collection Time: 12/26/15  6:20 AM  Result Value Ref Range   Cholesterol 159 0 - 200 mg/dL   Triglycerides 68 <161 mg/dL   HDL 50 >09 mg/dL   Total CHOL/HDL Ratio 3.2 RATIO   VLDL 14 0 - 40 mg/dL   LDL Cholesterol 95 0 - 99 mg/dL    Comment:        Total Cholesterol/HDL:CHD Risk Coronary Heart Disease Risk Table                     Men   Women  1/2 Average Risk   3.4   3.3  Average Risk       5.0   4.4  2 X Average Risk   9.6   7.1  3 X Average Risk  23.4   11.0        Use the calculated Patient Ratio above and the CHD Risk Table to determine the patient's CHD Risk.        ATP III CLASSIFICATION (LDL):  <100     mg/dL   Optimal  604-540  mg/dL   Near or Above                    Optimal  130-159  mg/dL   Borderline  981-191  mg/dL   High  >478     mg/dL   Very High Performed at [facility]   Hemoglobin A1c     Status: Abnormal   Collection Time: 12/26/15  6:20 AM  Result Value Ref Range   Hgb A1c MFr Bld 6.1 (H) 4.8 - 5.6 %    Comment: (NOTE)         Pre-diabetes: 5.7 - 6.4         Diabetes: >6.4         Glycemic control for adults with diabetes: <7.0    Mean Plasma Glucose 128 mg/dL    Comment: (NOTE) Performed At: [facility] [address] [place], [place] [id number] Mila Homer MD [id number] Performed at [facility]   Prolactin     Status: Abnormal   Collection Time: 12/26/15  6:20 AM  Result Value Ref Range   Prolactin 84.1 (H) 4.8 - 23.3 ng/mL    Comment: (NOTE) Performed At: [facility] [address] [place], [place] [id number] Mila Homer MD [id number] Performed at [facility]     Blood Alcohol level:  Lab Results  Component Value Date   ETH <5 12/22/2015    Physical Findings: AIMS:  , ,  ,  ,    CIWA:    COWS:     Musculoskeletal: Strength & Muscle Tone:  within normal limits Gait & Station: normal Patient leans: N/A  Psychiatric Specialty Exam: Review of Systems  Psychiatric/Behavioral: The patient is nervous/anxious.   All other systems reviewed and are negative.   Blood pressure 116/63, pulse 124, temperature 98.5 F (36.9 C), temperature source Oral, resp. rate 20, height 5\' 6"  (1.676 m), weight 114.08 kg (251 lb 8 oz), SpO2 100 %.Body mass index is 40.61 kg/(m^2).  General Appearance: Fairly Groomed  Patent attorneyye Contact::  Fair  Speech:  Slow  Volume:  Normal  Mood:  Anxious  Affect:  Congruent  Thought Process:  Irrelevant at times  Orientation:  Full (Time, Place, and Person)  Thought Content:  Delusions, Paranoid Ideation and Rumination  Suicidal Thoughts:  No   Homicidal Thoughts:  No  Memory:  Immediate;   Fair Recent;   Fair Remote;   Fair  Judgement:  Impaired  Insight:  Shallow  Psychomotor Activity:  Restlessness  Concentration:  Fair  Recall:  FiservFair  Fund of Knowledge:Fair  Language: Fair  Akathisia:  No  Handed:  Right  AIMS (if indicated):     Assets:  Desire for Improvement  ADL's:  Intact  Cognition: WNL  Sleep:  Number of Hours: 6.5   12/26/15  Reviewed records from [facility] - Pt presented as catatonic , disorganized on Nov 18, 2015 and was discharged on march 14 , 2017. Pt was on Forced medication at [facility] and she received Invega sustenna IM 234 mg on March 2 , 2017,and 156 mg on March 9 the 2017. She is due for Invega sustenna IM 117 mg on march 31 st . Pt was advised to continue Invega er 6 mg po daily for 15 days after discharge.     Treatment Plan Summary:Juanda Janee Mornhompson is a 20 y.o. AA female, single , unemployed , has a  hx of schizoaffective disorder ,  patient admitted with paranoia and agitation. Pt today continues to have some thought blocking as well as is paranoid on and off. Will continue treatment.  Daily contact with patient to assess and evaluate symptoms and progress in treatment  and Medication management  Will continue Invega Sustenna IM 117 mg q28 days - next dose on 01/04/16. Will discontinue Invega ER PO  , start Abilify 5 mg po qhs to augment the effect of Invega sustenna IM  as well as for elevated PL level. Will make available PRN medications as per agitation protocol. Will continue to monitor vitals ,medication compliance and treatment side effects while patient is here.  Will monitor for medical issues as well as call consult as needed.  Awaiting Herpes PCR results.  Reviewed labs  ekg - for qtc wnl ,  tsh- wnl , lipid panel- wnl , hba1c- 6.1 , PL- 84 -will need to be monitored on an out patient basis . CSW will continue working on disposition.  Patient to participate in therapeutic milieu .  Jaryah Aracena, MD 12/27/2015, 2:43 PM

## 2015-12-27 NOTE — [facility] Group Notes (Signed)
[facility] Group Notes:  (Counselor/Nursing/MHT/Case Management/Adjunct)  12/27/2015 1:15PM  Type of Therapy:  Group Therapy  Participation Level:  Active  Participation Quality:  Appropriate  Affect:  Flat  Cognitive:  Oriented  Insight:  Improving  Engagement in Group:  Limited  Engagement in Therapy:  Limited  Modes of Intervention:  Discussion, Exploration and Socialization  Summary of Progress/Problems: The topic for group was balance in life.  Pt participated in the discussion about when their life was in balance and out of balance and how this feels.  Pt discussed ways to get back in balance and short term goals they can work on to get where they want to be.    Daryel Geraldorth, Rodney B 12/27/2015 1:04 PM   [facility]

## 2015-12-27 NOTE — Progress Notes (Signed)
Adult Psychoeducational Group Note  Date:  12/27/2015 Time:  8:10 PM  Group Topic/Focus:  Wrap-Up Group:   The focus of this group is to help patients review their daily goal of treatment and discuss progress on daily workbooks.  Participation Level:  Did Not Attend  Pt was asleep during wrap-up group.    Evelyn NeerJasmine Alvarez Evelyn Alvarez 12/27/2015, 8:42 PM

## 2015-12-28 LAB — HERPES SIMPLEX VIRUS(HSV) DNA BY PCR
HSV 1 DNA: NEGATIVE
HSV 2 DNA: NEGATIVE

## 2015-12-28 LAB — GC/CHLAMYDIA PROBE AMP (~~LOC~~) NOT AT ARMC
CHLAMYDIA, DNA PROBE: NEGATIVE
NEISSERIA GONORRHEA: NEGATIVE

## 2015-12-28 NOTE — Clinical Social Work Note (Addendum)
Referred patient for care coordination - per [facility], [place] and is therefore ineligible for care coordination from [facility].  Patient has [employer] care coordinator, Silvio Claymanicholas Riddle [phone]).    Santa GeneraAnne Vernie Piet, LCSW Lead Clinical Social Worker Phone:  [phone]

## 2015-12-28 NOTE — Tx Team (Signed)
Interdisciplinary Treatment Plan Update (Adult)  Date: TD@  Time Reviewed: NOW@   Progress in Treatment: Attending groups: Yes. Participating in groups:  Yes. Taking medication as prescribed:  Yes. Tolerating medication:  Yes. Family/Significant othe contact made:  Pt has spoken w father, grandmother re housing; CSW unable to reach family 3/24 Patient understands diagnosis:  Yes. Discussing patient identified problems/goals with staff:  Yes. Medical problems stabilized or resolved:  Yes. Denies suicidal/homicidal ideation: Yes. Issues/concerns per patient self-inventory:  Other:  New problem(s) identified:    Discharge Plan or Barriers:   Reason for Continuation of Hospitalization: Anxiety Depression Hallucinations Medication stabilization  Comments:    Estimated length of stay:  5 - 7 daysw  New goal(s):   Additional Comments:  Patient and CSW reviewed pt's identified goals and treatment plan. Patient verbalized understanding and agreed to treatment plan. CSW reviewed [facility] "Discharge Process and Patient Involvement" Form. Pt verbalized understanding of information provided and signed form  Patient has a long history of psychiatric hospitalizations -was discharged from [facility] to [facility], left after 5 months, became homeless in [place] area.  After release from [facility], became homeless in [place].  Was hospitalized again at [facility] after becoming homeless.  Has [place] [employer], family supports in [facility].  Care coordinator recommends ACT team in [facility], shelter placement if natural supports are unavailable.  Patient's long term goal is return to [employer].    Review of initial/current patient goals per problem list:    1.  Goal(s): Patient will participate in aftercare plan  Met:    Target date: at discharge  As evidenced by: Patient will participate within aftercare plan AEB aftercare provider and  housing plan at discharge being identified. 3/24:  Patient referred to [place] Team in [place], assessing natural supports and availability of shelter beds in [facility].  Goal progressing.  Edwyna Shell, LCSW  2.  Goal (s): Patient will exhibit decreased depressive symptoms and suicidal ideations.  Met:     Target date: at discharge  As evidenced by: Patient will utilize self rating of depression at 3 or below and demonstrate decreased signs of depression or be deemed stable for discharge by MD.  3/24:  Patient presents w flat affect, tearful in group,  and rates depression as "high",denies SI.   goal progressing.  Edwyna Shell, LCSW  3.  Goal(s): Patient will demonstrate decreased signs and symptoms of anxiety.  Met:   Target date: at discharge  As evidenced by: Patient will utilize self rating of anxiety at 3 or below and demonstrated decreased signs of anxiety, or be deemed stable for discharge by MD  3/24:  Patient reports rapid heart rate, subjective feeling of anxiety, rates anxiety as "high", goal not met.  Edwyna Shell, LCSW    Attendees: Patient:   12/28/2015 4:04 PM   Family:   12/28/2015 4:04 PM   Physician:  Ursula Alert, MD 12/28/2015 4:04 PM   Nursing:   Chong Sicilian RN 12/28/2015 4:04 PM   Clinical Social Worker: Edwyna Shell, LCSW 12/28/2015 4:04 PM   Clinical Social Worker:  12/28/2015 4:04 PM   Other:  Gerline Legacy Nurse Case Manager 12/28/2015 4:04 PM   Other:   12/28/2015 4:04 PM   Other:   12/28/2015 4:04 PM   Other:  12/28/2015 4:04 PM   Other:  12/28/2015 4:04 PM   Other:  12/28/2015 4:04 PM    12/28/2015 4:04 PM    12/28/2015 4:04  PM    12/28/2015 4:04 PM    12/28/2015 4:04 PM    Scribe for Treatment Team:   Edwyna Shell, LCSW 12/28/2015 4:04 PM

## 2015-12-28 NOTE — NC FL2 (Signed)
[place] MEDICAID FL2 LEVEL OF CARE SCREENING TOOL     IDENTIFICATION  Patient Name: Evelyn Alvarez Birthdate: [birth year] Sex: female Admission Date (Current Location): 12/24/2015  [facility] [place]:   [facility] [id number] R Facility and Address:   [facility], [address], [place]  [id number])      Provider Number: [id number]  Attending Physician Name and Address:  Jomarie LongsSaramma Eappen, MD  Relative Name and Phone Number:       Current Level of Care: Hospital Recommended Level of Care: Family Care Home, Assisted Living Facility Prior Approval Number:    Date Approved/Denied:   PASRR Number:    Discharge Plan: Domiciliary (Rest home)    Current Diagnoses: Patient Active Problem List   Diagnosis Date Noted  . Hyperprolactinemia (HCC) 12/27/2015  . Schizoaffective disorder, bipolar type (HCC) 12/25/2015    Orientation RESPIRATION BLADDER Height & Weight     Self, Time, Situation, Place  Normal Continent Weight: 251 lb 8 oz (114.08 kg) Height:  5\' 6"  (167.6 cm)  BEHAVIORAL SYMPTOMS/MOOD NEUROLOGICAL BOWEL NUTRITION STATUS      Continent Diet (Regular diet)  AMBULATORY STATUS COMMUNICATION OF NEEDS Skin   Independent Verbally Normal                       Personal Care Assistance Level of Assistance  Bathing, Feeding, Dressing Bathing Assistance: Independent Feeding assistance: Independent Dressing Assistance: Independent     Functional Limitations Info  Sight, Hearing, Speech Sight Info: Adequate Hearing Info: Adequate Speech Info: Adequate    SPECIAL CARE FACTORS FREQUENCY                       Contractures Contractures Info: Not present    Additional Factors Info  Psychotropic     Psychotropic Info: See MAR         Current Medications (12/28/2015):  This is the current hospital active medication list Current Facility-Administered Medications  Medication Dose Route Frequency Provider Last Rate  Last Dose  . acetaminophen (TYLENOL) tablet 650 mg  650 mg Oral Q6H PRN Charm RingsJamison Y Lord, NP      . alum & mag hydroxide-simeth (MAALOX/MYLANTA) 200-200-20 MG/5ML suspension 30 mL  30 mL Oral Q4H PRN Charm RingsJamison Y Lord, NP      . ARIPiprazole (ABILIFY) tablet 5 mg  5 mg Oral QHS Jomarie LongsSaramma Eappen, MD   5 mg at 12/27/15 2200  . benztropine (COGENTIN) tablet 0.5 mg  0.5 mg Oral QHS Saramma Eappen, MD   0.5 mg at 12/27/15 2200  . hydrOXYzine (ATARAX/VISTARIL) tablet 25 mg  25 mg Oral TID PRN Jomarie LongsSaramma Eappen, MD   25 mg at 12/28/15 0808  . magnesium hydroxide (MILK OF MAGNESIA) suspension 30 mL  30 mL Oral Daily PRN Charm RingsJamison Y Lord, NP   30 mL at 12/25/15 1048  . menthol-cetylpyridinium (CEPACOL) lozenge 3 mg  1 lozenge Oral PRN Charm RingsJamison Y Lord, NP      . OLANZapine zydis (ZYPREXA) disintegrating tablet 10 mg  10 mg Oral TID PRN Jomarie LongsSaramma Eappen, MD       Or  . OLANZapine (ZYPREXA) injection 10 mg  10 mg Intramuscular TID PRN Jomarie LongsSaramma Eappen, MD      . Melene Muller[START ON 01/04/2016] paliperidone (INVEGA SUSTENNA) injection 117 mg  117 mg Intramuscular Q28 days Jomarie LongsSaramma Eappen, MD      . traZODone (DESYREL) tablet 50 mg  50 mg Oral QHS PRN Jomarie LongsSaramma Eappen, MD  Discharge Medications: Please see discharge summary for a list of discharge medications.  Relevant Imaging Results:  Relevant Lab Results:   Additional Information Full code, no known allergies  Sallee Lange, LCSW

## 2015-12-28 NOTE — Progress Notes (Signed)
Patient ID: Evelyn Alvarez, female   DOB: 1996/03/08, 20 y.o.   MRN: [id number] [facility]  12/28/2015 3:29 PM Evelyn Normanikki Weitz  MRN:  [id number]  Subjective: Patient states " I'm doing good, just feeling sluggish"   Objective:Edy Janee Mornhompson is a 20 y.o. AA female, single , unemployed , has a  hx of schizoaffective disorder ,  patient admitted with paranoia and agitation .  [name] is seen, chart reviewed. Discussed patient with treatment team. She is alert. Visible on the unit. Participating in the group sessions. She says she is doing good today just sluggish. She asked for Cogentin to be added to the list of her medications. Medication reviewed, showed she has been on cogentin all along. Informed the staff to inform her what her medications are for during medication administration. She says she is scared to sleep sometimes because she had been homeless prior to hospitalization. Says her father lives in [place] & would like to contact her father prior to discharge to see if he would allow her to come live with him after discharge. She currently denies any SIHI, AVH.   Principal Problem: Schizoaffective disorder, bipolar type (HCC) Diagnosis:   Patient Active Problem List   Diagnosis Date Noted  . Hyperprolactinemia (HCC) [E22.1] 12/27/2015  . Schizoaffective disorder, bipolar type (HCC) [F25.0] 12/25/2015   Total Time spent with patient: 30 minutes  Past Psychiatric History: Schizoaffective disorder  Past Medical History: Denies hx of HTN, thyroid disease,asthma Past Medical History  Diagnosis Date  . Schizoaffective disorder (HCC)    History reviewed. No pertinent past surgical history. Family History:  Family History  Problem Relation Age of Onset  . Mental illness Neg Hx   . Diabetes Maternal Grandmother   . Alcoholism Maternal Grandmother    Family Psychiatric  History: Pt reports her mother has anger management issues. Her grand mother used to be an alcoholic. Social  History: Pt is single , was living at a program at [place] , but currently reports she is homeless.  History  Alcohol Use No     History  Drug Use Not on file    Social History   Social History  . Marital Status: Single    Spouse Name: N/A  . Number of Children: N/A  . Years of Education: N/A   Social History Main Topics  . Smoking status: Never Smoker   . Smokeless tobacco: None  . Alcohol Use: No  . Drug Use: None  . Sexual Activity: Not Asked   Other Topics Concern  . None   Social History Narrative   Additional Social History:   Sleep: Fair  Appetite:  Fair  Current Medications: Current Facility-Administered Medications  Medication Dose Route Frequency Provider Last Rate Last Dose  . acetaminophen (TYLENOL) tablet 650 mg  650 mg Oral Q6H PRN Charm RingsJamison Y Lord, NP      . alum & mag hydroxide-simeth (MAALOX/MYLANTA) 200-200-20 MG/5ML suspension 30 mL  30 mL Oral Q4H PRN Charm RingsJamison Y Lord, NP      . ARIPiprazole (ABILIFY) tablet 5 mg  5 mg Oral QHS Jomarie LongsSaramma Eappen, MD   5 mg at 12/27/15 2200  . benztropine (COGENTIN) tablet 0.5 mg  0.5 mg Oral QHS Saramma Eappen, MD   0.5 mg at 12/27/15 2200  . hydrOXYzine (ATARAX/VISTARIL) tablet 25 mg  25 mg Oral TID PRN Jomarie LongsSaramma Eappen, MD   25 mg at 12/28/15 0808  . magnesium hydroxide (MILK OF MAGNESIA) suspension 30 mL  30 mL Oral Daily  PRN Charm Rings, NP   30 mL at 12/25/15 1048  . menthol-cetylpyridinium (CEPACOL) lozenge 3 mg  1 lozenge Oral PRN Charm Rings, NP      . OLANZapine zydis (ZYPREXA) disintegrating tablet 10 mg  10 mg Oral TID PRN Jomarie Longs, MD       Or  . OLANZapine (ZYPREXA) injection 10 mg  10 mg Intramuscular TID PRN Jomarie Longs, MD      . Melene Muller ON 01/04/2016] paliperidone (INVEGA SUSTENNA) injection 117 mg  117 mg Intramuscular Q28 days Jomarie Longs, MD      . traZODone (DESYREL) tablet 50 mg  50 mg Oral QHS PRN Jomarie Longs, MD       Lab Results:  Results for orders placed or performed during  the hospital encounter of 12/24/15 (from the past 48 hour(s))  GC/Chlamydia probe amp ([place])not at [facility]     Status: None   Collection Time: 12/27/15 12:00 AM  Result Value Ref Range   Chlamydia Negative     Comment: Normal Reference Range - Negative   Neisseria gonorrhea Negative     Comment: Normal Reference Range - Negative   Blood Alcohol level:  Lab Results  Component Value Date   ETH <5 12/22/2015   Physical Findings: AIMS:  , ,  ,  ,    CIWA:    COWS:     Musculoskeletal: Strength & Muscle Tone: within normal limits Gait & Station: normal Patient leans: N/A  Psychiatric Specialty Exam: Review of Systems  Psychiatric/Behavioral: The patient is nervous/anxious.   All other systems reviewed and are negative.   Blood pressure 111/66, pulse 108, temperature 98.1 F (36.7 C), temperature source Oral, resp. rate 16, height  (1.676 m), weight 114.08 kg (251 lb 8 oz), SpO2 100 %.Body mass index is 40.61 kg/(m^2).  General Appearance: Fairly Groomed  Patent attorney::  Fair  Speech:  Slow  Volume:  Normal  Mood:  Anxious  Affect:  Congruent  Thought Process:  Irrelevant at times  Orientation:  Full (Time, Place, and Person)  Thought Content:  Delusions, Paranoid Ideation and Rumination  Suicidal Thoughts:  No   Homicidal Thoughts:  No  Memory:  Immediate;   Fair Recent;   Fair Remote;   Fair  Judgement:  Impaired  Insight:  Shallow  Psychomotor Activity:  Restlessness  Concentration:  Fair  Recall:  [employer] of Knowledge:Fair  Language: Fair  Akathisia:  No  Handed:  Right  AIMS (if indicated):     Assets:  Desire for Improvement  ADL's:  Intact  Cognition: WNL  Sleep:  Number of Hours: 6.5   12/26/15  Reviewed records from [facility] - Pt presented as catatonic , disorganized on Nov 18, 2015 and was discharged on march 14 , 2017. Pt was on Forced medication at [facility] and she received Invega sustenna IM 234 mg on March 2 , 2017,and 156 mg on  March 9 the 2017. She is due for Invega sustenna IM 117 mg on march 31 st . Pt was advised to continue Invega er 6 mg po daily for 15 days after discharge.   Treatment Plan Summary: Trinisha Paget is a 20 y.o. AA female, single , unemployed , has a  hx of schizoaffective disorder ,  patient admitted with paranoia and agitation. Pt today continues to have some thought blocking as well as is paranoid on and off. Will continue treatment.  Daily contact with patient to assess and  evaluate symptoms and progress in treatment and Medication management Will continue Invega Sustenna IM 117 mg q28 days - next dose on 01/04/16. Will discontinue Invega ER PO, start Abilify 5 mg po qhs to augment the effect of Invega sustenna IM  as well as for elevated PL level. Will continue continue Cogentin 0.5 mg at bedtime for EPS. Will make available PRN medications as per agitation protocol. Will continue to monitor vitals, medication compliance and treatment side effects while patient is here.  Will monitor for medical issues as well as call consult as needed. Reviewed STD results, both Chlamydia & Gonorrhea - negative    Awaiting Herpes PCR results.  Reviewed labs  ekg - for qtc wnl ,  tsh- wnl , lipid panel- wnl , hba1c- 6.1 , PL- 84 -will need to be monitored on an out patient basis . CSW will continue working on disposition.  Patient to participate in therapeutic milieu .  Armandina Stammer I, NP, PMHNP-BC 12/28/2015, 3:29 PM

## 2015-12-28 NOTE — Clinical Social Work Note (Signed)
CSW met w patient to discuss discharge planning.  Patient wants to return to [facility] - asked CSW to contact and assess her ability to return.  CSW spoke w Keenan Bachelor, [facility] admissions, patient is welcome to return however it is a 2 month process.  Suggested patient call [facility] directly to begin assessment process.  Also suggested that CSW begin PASARR process for TCLI support.  Pt tearful, anxious, heart racing due to anxiety, cannot identify realistic plan for stability, fears for her safety, states she has no family support, has contacted father, aunt and grandmother - none of whom will provide support.  Was referred to [facility] after lengthy hospitalization at [facility].  Left after conflict w roommate, now states that was "poor decision, I need to learn to stop and think."  After leaving [facility], was hospitalized at [facility], at discharge was sent to [facility] to access resources for shelter.  No shelter was available other than "white flag emergency" placement when temperatures were below freezing.  Patient was unable to achieve stability, became anxious and overwhelmed, went to ED and was admitted to [facility] for current admission. CSW will refer for PASARR, refer to open ACT team, provide patient w resources to contact [facility] and complete application process, investigate possibility of mental health group home placement and refer for care coordination support for additional resources.  Patient already linked to [facility] program who state they are unable to assist w shelter at present.  Edwyna Shell, LCSW Lead Clinical Social Worker Phone:  [phone]

## 2015-12-28 NOTE — Progress Notes (Signed)
Recreation Therapy Notes  03.24.2017 LRT returned to work with patient, patient observed to be in bed sleeping when LRT arrived to unit. LRT will return during patient admission. Marykay Lexenise L Belkis Norbeck, LRT/CTRS         Jearl KlinefelterBlanchfield, Latondra Gebhart L 12/28/2015 3:55 PM

## 2015-12-28 NOTE — [facility] Group Notes (Signed)
[facility] LCSW Group Therapy   12/28/2015 1:40 PM  Type of Therapy: Group Therapy  Participation Level:  Active  Participation Quality:  Attentive  Affect:  Flat  Cognitive:  Oriented  Insight:  Limited  Engagement in Therapy:  Engaged  Modes of Intervention:  Discussion and Socialization  Summary of Progress/Problems: Chaplain was here to lead a group on themes of hope and/or courage.   "All I know is that I don't know." Pt spoke about being homeless and being very anxious about that. Pt explained that she needed to remember to have perseverance and patience in order to get through her situation. She is hopeful that with the right supports she will be able to be successful.  [name] 12/28/2015 1:40 PM

## 2015-12-28 NOTE — [facility] Group Notes (Signed)
[facility]   12/28/2015 11:54 AM  Participation Quality: Active    Mood/Affect:  Tearful  Depression Rating: High   Anxiety Rating: High  Thoughts of Suicide:  No Will you contract for safety?   NA  Current AVH:  Yes  Plan for Discharge/Comments: Pt was set to d/c today but will not be d/c because plan is too unstable. Pt has a lot of anxiety about what she will do when she leaves the hospital and became very tearful during group. "I don't know how to gain control over my life." ACT team referral being made.  Transportation Means: TCT  Supports: Mental health providers  Jonathon JordanLynn B Bryant

## 2015-12-28 NOTE — Progress Notes (Signed)
Patient ID: Evelyn Alvarez, female   DOB: 1996-03-20, 20 y.o.   MRN: [id number] D: Patient reports her medication is making her feel anxious and scared. Pt later refused all evening medication because she is afraid it will make it worse. Denies  SI/HI/AVH and pain.No behavioral issues noted.  A: Vital signs was WDL. Encourage pt to stay in quite room with peers but refused.  R: Patient safe, resting in bed. Will continue to monitor patient for safety and stability.

## 2015-12-28 NOTE — Progress Notes (Signed)
[name] says " I don't want to be here..I want to go home...". She is flat and sad. She completed her daily assessment and on it she wrote she denied SI today and she rated her depression, hopelessness and anxiety " 0/1/3", respectively.  A She has been out in the milieu.She is seen talking on the telephone as well as playing cards with the other patients and watching TV. A She is smiling and says " guess what.Marland Kitchen.Marland Kitchen.I might be able to live with my father". She requested and was given prn vistaril this morning ( " for my chest" ) and stated relied from her anxiety 1 hr later. R Safety is in place and support is offered.

## 2015-12-28 NOTE — Progress Notes (Signed)
Patient c/o of tenderness to her labial folds upon touch.  Per patient, when she lived in [place], she saw a family MD at the community clinic who diagnosed her with genital warts on physical exam.  She also adds that she has had unprotected sex and and paiful intercourse.  GC/Chlmaydia urine sent and HSV PCR lab drawn.  Results pending.

## 2015-12-28 NOTE — Clinical Social Work Note (Signed)
CSW and patient spoke w Levy PupaJamie Moreheart, [address] [facility] [place] ACT team lead [phone]).  Patient's clinicals sent to this provider, ACT team can begin services [place].  [facility] care coordinator Silvio Claymanicholas Riddle [phone]) aware.  Patient encouraged to discuss ACT team support w father and grandmother - all natural supports are [place] [facility] [place] will provide additional support to patient whether she is able to reside w family members or access [facility].  [facility], ACT team will assist.  Patient verbalized appreciation for option of [place] [facility] [place].  PASARR will screen patient - has been assigned to [facility].  Care coordinator requested this referral so that patient could be served by [facility].  CSW and patient attempted to contact patient's father and grandmother - no answer at either, patient encouraged to try again on Monday w CSW if needed.  Santa GeneraAnne Twisha Vanpelt, LCSW Lead Clinical Social Worker Phone:  [phone]

## 2015-12-29 NOTE — Progress Notes (Signed)
Patient ID: Evelyn Alvarez, female   DOB: [birth year], 20 y.o.   MRN: [id number] D: Client in bed this shift, refuses medication, isolates. A: Writer continue to stimulate client, encouraging medications. Client refuses. Staff will monitor q3915min for safety. R: Client is safe on the unit, did not attend group.

## 2015-12-29 NOTE — Progress Notes (Signed)
D: Patient pleasant and animated on approach. Pt isolative to her room for much of the shift. Pt requesting to have her HS medications earlier because she was tired and was ready for bed. Pt with no inappropriate behaviors noted this shift. A: Q15 minute safety checks, encourage staff/peer interaction, administer medications as ordered. R: Pt denies SI/HI or plans to harm herself. No s/s of distress noted.

## 2015-12-29 NOTE — B[facility] Group Notes (Signed)
[facility] Group Notes:  (Clinical Social Work)  12/29/2015  11:15-12:00PM  Summary of Progress/Problems:   Today's process group involved patients discussing their feelings related to being hospitalized, as well as how they can use their present feelings to create a plan for how to avoid future hospitalizations. The patient expressed her primary feeling about being hospitalized is "safe."  She asked questions during group, wanting to know if she was being studied or watched or talked about.  She seemed to accept CSW responses that she was not being targeted, but that observation if part of how staff knows if she needs anything.  She was pleasant throughout group and showed insight that other patients sometimes did not.  Type of Therapy:  Group Therapy - Process  Participation Level:  Active  Participation Quality:  Attentive, Sharing and Supportive  Affect:  Appropriate  Cognitive:  Alert, Appropriate and Oriented  Insight:  Improving  Engagement in Therapy:  Engaged  Modes of Intervention:  Exploration, Discussion  Ambrose MantleMareida Grossman-Orr, LCSW 12/29/2015, 1:27 PM

## 2015-12-29 NOTE — Progress Notes (Signed)
Pt did not attend wrap up group this evening.  

## 2015-12-29 NOTE — Progress Notes (Signed)
Patient ID: Evelyn Alvarez, female   DOB: Oct 10, 1995, 20 y.o.   MRN: [id number] [facility] MD Progress Note  12/29/2015 12:31 PM Evelyn Alvarez  MRN:  [id number]  Subjective: Patient states doing somewhat good " i am homeless" remains worried about her after care.   Objective:Evelyn Alvarez is a 20 y.o. AA female, single , unemployed , has a  hx of schizoaffective disorder ,  patient was admitted with paranoia and agitation .  Evelyn Alvarez is seen, chart reviewed. Discussed patient with treatment team. She is alert. Visible on the unit. Participating in the group sessions. She says she is doing good today just sluggish. Medication reviewed, showed she has been on cogentin all along. Remains concern of her after care. Says her father lives in [place] & would like to contact her father prior to discharge to see if he would allow her to come live with him after discharge. She currently denies any SIHI, AVH.   Principal Problem: Schizoaffective disorder, bipolar type (HCC) Diagnosis:   Patient Active Problem List   Diagnosis Date Noted  . Hyperprolactinemia (HCC) [E22.1] 12/27/2015  . Schizoaffective disorder, bipolar type (HCC) [F25.0] 12/25/2015   Total Time spent with patient: 30 minutes  Past Psychiatric History: Schizoaffective disorder  Past Medical History: Denies hx of HTN, thyroid disease,asthma Past Medical History  Diagnosis Date  . Schizoaffective disorder (HCC)    History reviewed. No pertinent past surgical history. Family History:  Family History  Problem Relation Age of Onset  . Mental illness Neg Hx   . Diabetes Maternal Grandmother   . Alcoholism Maternal Grandmother    Family Psychiatric  History: Pt reports her mother has anger management issues. Her grand mother used to be an alcoholic. Social History: Pt is single , was living at a program at [place] , but currently reports she is homeless.  History  Alcohol Use No     History  Drug Use Not on file    Social History    Social History  . Marital Status: Single    Spouse Name: N/A  . Number of Children: N/A  . Years of Education: N/A   Social History Main Topics  . Smoking status: Never Smoker   . Smokeless tobacco: None  . Alcohol Use: No  . Drug Use: None  . Sexual Activity: Not Asked   Other Topics Concern  . None   Social History Narrative   Additional Social History:   Sleep: Fair  Appetite:  Fair  Current Medications: Current Facility-Administered Medications  Medication Dose Route Frequency Provider Last Rate Last Dose  . acetaminophen (TYLENOL) tablet 650 mg  650 mg Oral Q6H PRN Evelyn Rings, NP      . alum & mag hydroxide-simeth (MAALOX/MYLANTA) 200-200-20 MG/5ML suspension 30 mL  30 mL Oral Q4H PRN Evelyn Rings, NP      . ARIPiprazole (ABILIFY) tablet 5 mg  5 mg Oral QHS Evelyn Longs, MD   Stopped at 12/28/15 2230  . benztropine (COGENTIN) tablet 0.5 mg  0.5 mg Oral QHS Evelyn Eappen, MD   0.5 mg at 12/27/15 2200  . hydrOXYzine (ATARAX/VISTARIL) tablet 25 mg  25 mg Oral TID PRN Evelyn Longs, MD   25 mg at 12/28/15 0808  . magnesium hydroxide (MILK OF MAGNESIA) suspension 30 mL  30 mL Oral Daily PRN Evelyn Rings, NP   30 mL at 12/25/15 1048  . menthol-cetylpyridinium (CEPACOL) lozenge 3 mg  1 lozenge Oral PRN Evelyn Rings, NP      .  OLANZapine zydis (ZYPREXA) disintegrating tablet 10 mg  10 mg Oral TID PRN Evelyn Longs, MD       Or  . OLANZapine (ZYPREXA) injection 10 mg  10 mg Intramuscular TID PRN Evelyn Longs, MD      . Melene Muller ON 01/04/2016] paliperidone (INVEGA SUSTENNA) injection 117 mg  117 mg Intramuscular Q28 days Evelyn Longs, MD      . traZODone (DESYREL) tablet 50 mg  50 mg Oral QHS PRN Evelyn Longs, MD       Lab Results:  Results for orders placed or performed during the hospital encounter of 12/24/15 (from the past 48 hour(s))  Herpes simplex virus(hsv) dna by pcr     Status: None   Collection Time: 12/27/15  7:19 PM  Result Value Ref Range    HSV 1 DNA Negative Negative   HSV 2 DNA Negative Negative    Comment: (NOTE) This test was developed and its performance characteristics determined by [employer]. It has not been cleared or approved by the U.S. Food and Drug Administration. The FDA has determined that such clearance or approval is not necessary. This test is used for clinical purposes. It should not be regarded as investigational or research. Performed At: [facility] [address] [place], [place] [id number] Evelyn Homer MD [id number] Performed at [facility]    Blood Alcohol level:  Lab Results  Component Value Date   [facility] <5 12/22/2015   Physical Findings: AIMS:  , ,  ,  ,    CIWA:    COWS:     Musculoskeletal: Strength & Muscle Tone: within normal limits Gait & Station: normal Patient leans: N/A  Psychiatric Specialty Exam: Review of Systems  Psychiatric/Behavioral: Negative for suicidal ideas. The patient is nervous/anxious.   All other systems reviewed and are negative.   Blood pressure 114/66, pulse 118, temperature 98.6 F (37 C), temperature source Oral, resp. rate 16, height  (1.676 m), weight 114.08 kg (251 lb 8 oz), SpO2 100 %.Body mass index is 40.61 kg/(m^2).  General Appearance: Fairly Groomed  Patent attorney::  Fair  Speech:  Slow  Volume:  Normal  Mood:  Anxious  Affect:  Congruent  Thought Process:  Irrelevant at times  Orientation:  Full (Time, Place, and Person)  Thought Content:  Somewhat paranoid  Suicidal Thoughts:  No   Homicidal Thoughts:  No  Memory:  Immediate;   Fair Recent;   Fair Remote;   Fair  Judgement:  Impaired  Insight:  Shallow  Psychomotor Activity:  Restlessness  Concentration:  Fair  Recall:  [employer] of Knowledge:Fair  Language: Fair  Akathisia:  No  Handed:  Right  AIMS (if indicated):     Assets:  Desire for Improvement  ADL's:  Intact  Cognition: WNL  Sleep:  Number of Hours: 6.75   12/26/15   Reviewed records from [facility] - Pt presented as catatonic , disorganized on Nov 18, 2015 and was discharged on march 14 , 2017. Pt was on Forced medication at [facility] and she received Invega sustenna IM 234 mg on March 2 , 2017,and 156 mg on March 9 the 2017. She is due for Invega sustenna IM 117 mg on march 31 st . Pt was advised to continue Invega er 6 mg po daily for 15 days after discharge.   Treatment Plan Summary: Evelyn Alvarez is a 20 y.o. AA female, single , unemployed , has a  hx of schizoaffective disorder ,  patient admitted with paranoia and agitation. Pt today continues to have some thought blocking as well as is paranoid on and off. Will continue treatment.  Daily contact with patient to assess and evaluate symptoms and progress in treatment and Medication management Will continue Invega Sustenna IM 117 mg q28 days - next dose on 01/04/16. Continue  Abilify 5 mg po qhs to augment the effect of Invega sustenna IM  as well as for elevated PL level. Will continue continue Cogentin 0.5 mg at bedtime for EPS. Will make available PRN medications as per agitation protocol. Will continue to monitor vitals, medication compliance and treatment side effects while patient is here.  Will monitor for medical issues as well as call consult as needed.  Awaiting Herpes PCR results.   CSW will continue working on disposition.  Patient to participate in therapeutic milieu .  Thresa RossAKHTAR, Denton Derks, MD    12/29/2015, 12:31 PM

## 2015-12-29 NOTE — Progress Notes (Signed)
Patient requesting HS medications at this time.

## 2015-12-29 NOTE — Progress Notes (Signed)
D-Patient was observed in the milieu interacting well with peers earlier in shift. Patient reported that she was feeling "better".  Before dinner, patient had complaints body "weakness".  Patient denied feelings of depression, anxiety, or hopelessness.  Patient currently denies SI/HI/AVH.  A- Scheduled medications administered to patient, per MD orders. Support and encouragement provided.  Routine safety checks conducted every 15 minutes.  Patient informed to notify staff with problems or concerns. R-Patient contracts for safety. Patient receptive, calm, and cooperative.  Patient remains safe at this time.

## 2015-12-30 NOTE — [facility] Group Notes (Signed)
[facility] Group Notes:  (Clinical Social Work)  12/30/2015  11:00AM-12:00PM  Summary of Progress/Problems:  The main focus of today's process group was to listen to a variety of genres of music and to identify that different types of music provoke different responses.  The patient then was able to identify personally what was soothing for them, as well as energizing, as well as how patient can personally use this knowledge in sleep habits, with depression, and with other symptoms.  The patient expressed at the beginning of group the overall feeling of being "motivated and grounded."  She enjoyed the music and expressed herself appropriately throughout group.    Type of Therapy:  Music Therapy   Participation Level:  Active  Participation Quality:  Attentive and Sharing  Affect:  Appropriate  Cognitive:  Oriented  Insight:  Engaged  Engagement in Therapy:  Engaged  Modes of Intervention:   Activity, Exploration  Ambrose MantleMareida Grossman-Orr, LCSW 12/30/2015

## 2015-12-30 NOTE — Progress Notes (Signed)
Patient ID: Evelyn Alvarez, female   DOB: [birth year], 20 y.o.   MRN: [id number] [facility] MD Progress Note  12/30/2015 12:11 PM Evelyn Alvarez  MRN:  [id number]  Subjective: Patient states doing somewhat good 'says i want to keep focused and not worry about being homeless for now"   Objective:Evelyn Alvarez is a 20 y.o. AA female, single , unemployed , has a  hx of schizoaffective disorder ,  patient was admitted with paranoia and agitation .  Evelyn Alvarez is seen, chart reviewed. Discussed patient with treatment team. She is alert. Visible on the unit. Participating in the group sessions. She says she is doing good today just sluggish. Medication reviewed, showed she has been on cogentin all along. Remains concern of her after care. Says her father lives in [place] and gets worried about her after care. Still has poor insight  She currently denies any SIHI, AVH. Tolerating medications. Less paranoid   Principal Problem: Schizoaffective disorder, bipolar type (HCC) Diagnosis:   Patient Active Problem List   Diagnosis Date Noted  . Hyperprolactinemia (HCC) [E22.1] 12/27/2015  . Schizoaffective disorder, bipolar type (HCC) [F25.0] 12/25/2015   Total Time spent with patient: 30 minutes  Past Psychiatric History: Schizoaffective disorder  Past Medical History: Denies hx of HTN, thyroid disease,asthma Past Medical History  Diagnosis Date  . Schizoaffective disorder (HCC)    History reviewed. No pertinent past surgical history. Family History:  Family History  Problem Relation Age of Onset  . Mental illness Neg Hx   . Diabetes Maternal Grandmother   . Alcoholism Maternal Grandmother    Family Psychiatric  History: Pt reports her mother has anger management issues. Her grand mother used to be an alcoholic. Social History: Pt is single , was living at a program at [place] , but currently reports she is homeless.  History  Alcohol Use No     History  Drug Use Not on file    Social History    Social History  . Marital Status: Single    Spouse Name: N/A  . Number of Children: N/A  . Years of Education: N/A   Social History Main Topics  . Smoking status: Never Smoker   . Smokeless tobacco: None  . Alcohol Use: No  . Drug Use: None  . Sexual Activity: Not Asked   Other Topics Concern  . None   Social History Narrative   Additional Social History:   Sleep: Fair  Appetite:  Fair  Current Medications: Current Facility-Administered Medications  Medication Dose Route Frequency Provider Last Rate Last Dose  . acetaminophen (TYLENOL) tablet 650 mg  650 mg Oral Q6H PRN Charm Rings, NP      . alum & mag hydroxide-simeth (MAALOX/MYLANTA) 200-200-20 MG/5ML suspension 30 mL  30 mL Oral Q4H PRN Charm Rings, NP      . ARIPiprazole (ABILIFY) tablet 5 mg  5 mg Oral QHS Jomarie Longs, MD   5 mg at 12/29/15 1959  . benztropine (COGENTIN) tablet 0.5 mg  0.5 mg Oral QHS Saramma Eappen, MD   0.5 mg at 12/29/15 1959  . hydrOXYzine (ATARAX/VISTARIL) tablet 25 mg  25 mg Oral TID PRN Jomarie Longs, MD   25 mg at 12/28/15 7829  . magnesium hydroxide (MILK OF MAGNESIA) suspension 30 mL  30 mL Oral Daily PRN Charm Rings, NP   30 mL at 12/25/15 1048  . menthol-cetylpyridinium (CEPACOL) lozenge 3 mg  1 lozenge Oral PRN Charm Rings, NP      .  OLANZapine zydis (ZYPREXA) disintegrating tablet 10 mg  10 mg Oral TID PRN Jomarie LongsSaramma Eappen, MD       Or  . OLANZapine (ZYPREXA) injection 10 mg  10 mg Intramuscular TID PRN Jomarie LongsSaramma Eappen, MD      . Melene Muller[START ON 01/04/2016] paliperidone (INVEGA SUSTENNA) injection 117 mg  117 mg Intramuscular Q28 days Jomarie LongsSaramma Eappen, MD      . traZODone (DESYREL) tablet 50 mg  50 mg Oral QHS PRN Jomarie LongsSaramma Eappen, MD       Lab Results:  No results found for this or any previous visit (from the past 48 hour(s)). Blood Alcohol level:  Lab Results  Component Value Date   ETH <5 12/22/2015   Physical Findings: AIMS: Facial and Oral Movements Muscles of Facial  Expression: None, normal Lips and Perioral Area: None, normal Jaw: None, normal Tongue: None, normal,Extremity Movements Upper (arms, wrists, hands, fingers): None, normal Lower (legs, knees, ankles, toes): None, normal, Trunk Movements Neck, shoulders, hips: None, normal, Overall Severity Severity of abnormal movements (highest score from questions above): None, normal Incapacitation due to abnormal movements: None, normal Patient's awareness of abnormal movements (rate only patient's report): No Awareness, Dental Status Current problems with teeth and/or dentures?: No Does patient usually wear dentures?: No  CIWA:    COWS:     Musculoskeletal: Strength & Muscle Tone: within normal limits Gait & Station: normal Patient leans: N/A  Psychiatric Specialty Exam: Review of Systems  Psychiatric/Behavioral: Negative for suicidal ideas. The patient is nervous/anxious.   All other systems reviewed and are negative.   Blood pressure 113/66, pulse 120, temperature 98.3 F (36.8 C), temperature source Oral, resp. rate 16, height 5\' 6"  (1.676 m), weight 114.08 kg (251 lb 8 oz), SpO2 100 %.Body mass index is 40.61 kg/(m^2).  General Appearance: Fairly Groomed  Patent attorneyye Contact::  Fair  Speech:  Slow  Volume:  Normal  Mood:  Anxious  Affect:  Congruent  Thought Process: not circumstantial.   Orientation:  Full (Time, Place, and Person)  Thought Content:  Somewhat paranoid  Suicidal Thoughts:  No   Homicidal Thoughts:  No  Memory:  Immediate;   Fair Recent;   Fair Remote;   Fair  Judgement:  Impaired  Insight:  Shallow  Psychomotor Activity:  Restlessness  Concentration:  Fair  Recall:  FiservFair  Fund of Knowledge:Fair  Language: Fair  Akathisia:  No  Handed:  Right  AIMS (if indicated):     Assets:  Desire for Improvement  ADL's:  Intact  Cognition: WNL  Sleep:  Number of Hours: 6   12/26/15  Reviewed records from [facility] - Pt presented as catatonic , disorganized on Nov 18, 2015 and was discharged on march 14 , 2017. Pt was on Forced medication at [facility] and she received Invega sustenna IM 234 mg on March 2 , 2017,and 156 mg on March 9 the 2017. She is due for Invega sustenna IM 117 mg on march 31 st . Pt was advised to continue Invega er 6 mg po daily for 15 days after discharge.   Treatment Plan Summary: Evelyn Alvarez is a 20 y.o. AA female, single , unemployed , has a  hx of schizoaffective disorder ,  patient admitted with paranoia and agitation. Pt today continues to have some thought blocking as well as is paranoid on and off. Will continue treatment.  Daily contact with patient to assess and evaluate symptoms and progress in treatment and Medication management  No change in  treatment needed . Some but slight improvement. Will continue [place] IM 117 mg q28 days - next dose on 01/04/16. Continue  Abilify 5 mg po qhs to augment the effect of Invega sustenna IM  as well as for elevated PL level. Will continue continue Cogentin 0.5 mg at bedtime for EPS. Will make available PRN medications as per agitation protocol. Will continue to monitor vitals, medication compliance and treatment side effects while patient is here.  Will monitor for medical issues as well as call consult as needed.   CSW will continue working on disposition.  Patient to participate in therapeutic milieu .  Evelyn Ross, MD    12/30/2015, 12:11 PM

## 2015-12-30 NOTE — Progress Notes (Signed)
D: Evelyn Alvarez appeared sweet and childlike upon approach this morning. She approached this writer to ask how she might get offer her medications -- she said she understood she needed to take them now, but she hoped to one day stop taking them. She denied SI/HI/AVH/pain. She has been calm and cooperative, presenting no problems on the unit. On her self inventory sheet, she reported fair sleep, fair appetite, normal energy level, and good concentration. She rated her depression zero, hopelessness zero, and anxiety two. She said her medication is "still making me feel sleepy." She said she hoped to work on being "my own friend and show[ing] self compassion," reflecting a conversation we'd had at the med window earlier. A: Meds given as ordered. Q15 safety checks maintained. Support/encouragement offered. R: Pt remains free from harm and continues with treatment. Will continue to monitor for needs/safety.

## 2015-12-30 NOTE — Progress Notes (Signed)
Pt did not attend wrap up group meeting this evening.  

## 2015-12-31 DIAGNOSIS — E221 Hyperprolactinemia: Secondary | ICD-10-CM

## 2015-12-31 MED ORDER — TRAZODONE HCL 100 MG PO TABS
100.0000 mg | ORAL_TABLET | Freq: Every day | ORAL | Status: DC
  Administered 2015-12-31: 100 mg via ORAL
  Filled 2015-12-31 (×2): qty 1

## 2015-12-31 MED ORDER — BENZTROPINE MESYLATE 0.5 MG PO TABS: 0.5000 mg | ORAL_TABLET | Freq: Every day | ORAL | Status: AC

## 2015-12-31 MED ORDER — PALIPERIDONE PALMITATE 156 MG/ML IM SUSP: 117.0000 mg | INTRAMUSCULAR | Status: AC

## 2015-12-31 MED ORDER — HYDROXYZINE HCL 25 MG PO TABS: 25.0000 mg | ORAL_TABLET | Freq: Three times a day (TID) | ORAL | Status: AC | PRN

## 2015-12-31 MED ORDER — ARIPIPRAZOLE 5 MG PO TABS: 5.0000 mg | ORAL_TABLET | Freq: Every day | ORAL | Status: DC

## 2015-12-31 MED ORDER — ARIPIPRAZOLE 5 MG PO TABS: 5.0000 mg | ORAL_TABLET | Freq: Every day | ORAL | Status: AC

## 2015-12-31 MED ORDER — TRAZODONE HCL 100 MG PO TABS: 100.0000 mg | ORAL_TABLET | Freq: Every day | ORAL | Status: AC

## 2015-12-31 NOTE — [facility] Group Notes (Signed)
[facility] LCSW Group Therapy  12/31/2015 1:15 pm  Type of Therapy: Process Group Therapy  Participation Level:  Active  Participation Quality:  Appropriate  Affect:  Flat  Cognitive:  Oriented  Insight:  Improving  Engagement in Group:  Limited  Engagement in Therapy:  Limited  Modes of Intervention:  Activity, Clarification, Education, Problem-solving and Support  Summary of Progress/Problems: Today's group addressed the issue of overcoming obstacles.  Patients were asked to identify their biggest obstacle post d/c that stands in the way of their on-going success, and then problem solve as to how to manage this.  Stayed the entire time, engaged throughout.  Gave another patient feedback, then needed clarification about whether she had been offended by the statement or not.  Talked about her obstacle of homelessness, and how she deals with that.  "I just tell myself that others have gone through this, and been OK, so I know I will be OK too."  Daryel Geraldorth, Kortne All B 12/31/2015   3:41 PM

## 2015-12-31 NOTE — Progress Notes (Signed)
Adult Psychoeducational Group Note  Date:  12/31/2015 Time:  10:19 PM  Group Topic/Focus:  Wrap-Up Group:   The focus of this group is to help patients review their daily goal of treatment and discuss progress on daily workbooks.  Participation Level:  Active  Participation Quality:  Appropriate  Affect:  Appropriate  Cognitive:  Appropriate  Insight: Appropriate  Engagement in Group:  Engaged  Modes of Intervention:  Socialization and Support  Additional Comments:  Patient attended and participated in group tonight. She reports that her day was productive. She was suppose to leave today but did not get to. She advised that she felt she failed herself because she did not speak up for herself to leave. She believed it would have made a different if she did.  Lita MainsFrancis, Alleen Kehm [facility] 12/31/2015, 10:19 PM

## 2015-12-31 NOTE — Progress Notes (Signed)
Recreation Therapy Notes  03.27.2017 approximately 10:15am LRT returned to review deep breathing with patient. Patient practiced with LRT again, demonstrating she is capable of independent practice post d/c. Patient asked questions about how to improve her self-esteem, as she states she knows others do not like her or enjoy working with her. LRT worked with patient on positive mantra she can state when she feels insecure. Patient receptive.   Marykay Lexenise L Calisa Luckenbaugh, LRT/CTRS   Azaylia Fong L 12/31/2015 1:00 PM

## 2015-12-31 NOTE — Progress Notes (Signed)
Patient ID: Evelyn Alvarez, female   DOB: 16-Jan-1996, 20 y.o.   MRN: [id number] D: Patient observed watching TV and interacting well with peers on approach. Denies  SI/HI/AVH and pain.No behavioral issues noted.  A: Support and encouragement offered as needed. Medications administered as prescribed.  R: Patient cooperative and appropriate on unit. Will continue to monitor patient for safety and stability.

## 2015-12-31 NOTE — Discharge Summary (Signed)
Physician Discharge Summary Note  Patient:  Evelyn Alvarez is an 20 y.o., female MRN:  [id number] DOB:  1996/05/23 Patient phone:  [phone] (home)  Patient address:   [address] [place] [address],  Total Time spent with patient: 30 minutes  Date of Admission:  12/24/2015 Date of Discharge: 01/01/2016  Reason for Admission:  Paranoia and agitation  Principal Problem: Schizoaffective disorder, bipolar type [facility]) Discharge Diagnoses: Patient Active Problem List   Diagnosis Date Noted  . Hyperprolactinemia (HCC) [E22.1] 12/27/2015  . Schizoaffective disorder, bipolar type (HCC) [F25.0] 12/25/2015    Past Psychiatric History:  See above noted  Past Medical History:  Past Medical History  Diagnosis Date  . Schizoaffective disorder (HCC)    History reviewed. No pertinent past surgical history. Family History:  Family History  Problem Relation Age of Onset  . Mental illness Neg Hx   . Diabetes Maternal Grandmother   . Alcoholism Maternal Grandmother    Family Psychiatric  History:  See above noted Social History:  History  Alcohol Use No     History  Drug Use Not on file    Social History   Social History  . Marital Status: Single    Spouse Name: N/A  . Number of Children: N/A  . Years of Education: N/A   Social History Main Topics  . Smoking status: Never Smoker   . Smokeless tobacco: None  . Alcohol Use: No  . Drug Use: None  . Sexual Activity: Not Asked   Other Topics Concern  . None   Social History Narrative    Hospital Course:  Evelyn Alvarez is a 20 y.o. female patient admitted with paranoia and agitation, schizoaffective disorder. She initially presented to the ED with paranoia that people were watching her which agitated her and made her want to hit them.    Evelyn Alvarez was admitted for Schizoaffective disorder, bipolar type [facility]) and crisis management.  She was treated with Gean [place] IM 117 mg q28 days - next dose on  01/04/16, Abilify 5 mg to augment the effect of Invega sustenna IM as well as for elevated PL level, Cogentin 0.5 mg at bedtime for EPS and available PRN medications as per agitation protocol. Patient was monitored for her vitals, medication compliance and treatment side effects.  Medical problems were identified and treated as needed.  Home medications were restarted as appropriate.  Improvement was monitored by observation and Evelyn Norman daily report of symptom reduction.  Emotional and mental status was monitored by daily self inventory reports completed by Evelyn Norman and clinical staff.  Patient reported continued improvement, denied any new concerns.  Patient had been compliant on medications and denied side effects.  Support and encouragement was provided.    Patient did well during inpatient stay.  At time of discharge, patient rated both depression and anxiety levels to be manageable and minimal.  Patient was able to identify the triggers of emotional crises and de-stabilizations.  Patient identified the positive things in life that would help in dealing with feelings of loss, depression and unhealthy or abusive tendencies.  To note, patient had c/o irritation and mild pain in her labial folds.  Patient reported that she was told by a "doctor that I had herpes and she gave me sunscreen for it.  I also had unprotected sex and I am worried about it.  Serum HSV PCR ordered and GC/Chlam urine ordered, both resulted negative.  Health teaching done on practicing safe sex.  Evelyn Normanikki Yanke was evaluated by the treatment team for stability and plans for continued recovery upon discharge.  She was offered further treatment options upon discharge including Residential, Intensive Outpatient and Outpatient treatment.  She will follow up with agencies listed below for medication management and counseling.  Encouraged patient to maintain satisfactory support network and home environment.  Advised to  adhere to medication compliance and outpatient treatment follow up.      Evelyn Alvarez motivation was an integral factor for scheduling further treatment.  Employment, transportation, bed availability, health status, family support, and any pending legal issues were also considered during her hospital stay.  Upon completion of this admission the patient was both mentally and medically stable for discharge denying suicidal/homicidal ideation, auditory/visual/tactile hallucinations, delusional thoughts and paranoia.      Physical Findings: AIMS: Facial and Oral Movements Muscles of Facial Expression: None, normal Lips and Perioral Area: None, normal Jaw: None, normal Tongue: None, normal,Extremity Movements Upper (arms, wrists, hands, fingers): None, normal Lower (legs, knees, ankles, toes): None, normal, Trunk Movements Neck, shoulders, hips: None, normal, Overall Severity Severity of abnormal movements (highest score from questions above): None, normal Incapacitation due to abnormal movements: None, normal Patient's awareness of abnormal movements (rate only patient's report): No Awareness, Dental Status Current problems with teeth and/or dentures?: No Does patient usually wear dentures?: No  CIWA:    COWS:     Musculoskeletal: Strength & Muscle Tone: within normal limits Gait & Station: normal Patient leans: N/A  Psychiatric Specialty Exam:  SEE MD SRA Review of Systems  Psychiatric/Behavioral: Negative for depression, suicidal ideas, hallucinations and substance abuse. The patient is not nervous/anxious.   All other systems reviewed and are negative.   Blood pressure 108/65, pulse 112, temperature 98.6 F (37 C), temperature source Oral, resp. rate 20, height 5\' 6"  (1.676 m), weight 114.08 kg (251 lb 8 oz), SpO2 100 %.Body mass index is 40.61 kg/(m^2).     Has this patient used any form of tobacco in the last 30 days? (Cigarettes, Smokeless Tobacco, Cigars, and/or Pipes) Yes,  NA  Blood Alcohol level:  Lab Results  Component Value Date   ETH <5 12/22/2015    Metabolic Disorder Labs:  Lab Results  Component Value Date   HGBA1C 6.1* 12/26/2015   MPG 128 12/26/2015   Lab Results  Component Value Date   PROLACTIN 84.1* 12/26/2015   Lab Results  Component Value Date   CHOL 159 12/26/2015   TRIG 68 12/26/2015   HDL 50 12/26/2015   CHOLHDL 3.2 12/26/2015   VLDL 14 12/26/2015   LDLCALC 95 12/26/2015    See Psychiatric Specialty Exam and Suicide Risk Assessment completed by Attending Physician prior to discharge.  Discharge destination:  Home  Is patient on multiple antipsychotic therapies at discharge:  Yes,   Do you recommend tapering to monotherapy for antipsychotics?  per outpatient mental health provider   Has Patient had three or more failed trials of antipsychotic monotherapy by history:  No  Recommended Plan for Multiple Antipsychotic Therapies: NA     Medication List    STOP taking these medications        paliperidone 6 MG 24 hr tablet  Commonly known as:  INVEGA      TAKE these medications      Indication   ARIPiprazole 5 MG tablet  Commonly known as:  ABILIFY  Take 1 tablet (5 mg total) by mouth at bedtime.   Indication:  mood stabilization  benztropine 0.5 MG tablet  Commonly known as:  COGENTIN  Take 1 tablet (0.5 mg total) by mouth at bedtime.   Indication:  Extrapyramidal Reaction caused by Medications     hydrOXYzine 25 MG tablet  Commonly known as:  ATARAX/VISTARIL  Take 1 tablet (25 mg total) by mouth 3 (three) times daily as needed for anxiety.   Indication:  Anxiety Neurosis     paliperidone 156 MG/ML Susp injection  Commonly known as:  INVEGA SUSTENNA  Inject 0.75 mLs (117 mg total) into the muscle every 28 (twenty-eight) days. Next dose is due Jan 04, 2016  Start taking on:  01/04/2016   Indication:  mood stabilization     traZODone 100 MG tablet  Commonly known as:  DESYREL  Take 1 tablet (100 mg  total) by mouth at bedtime.   Indication:  Trouble Sleeping, Major Depressive Disorder       Follow-up Information    Follow up with [facility].   Why:  Call Selena Batten when you arrive in [place] and confirm where you are so she can come visit with you.  [phone].     Contact information:   [address]  [place], [place] [address]        Phone: [phone]  Fax: [phone]        Schedule an appointment as soon as possible for a visit with [facility] has several locations in [place].  Choose location closest to where you will be staying at..   Why:  to establish primary care     Follow-up recommendations:  Activity:  as tol Diet:  as tol  Comments:  1.  Take all your medications as prescribed.   2.  Report any adverse side effects to outpatient provider. 3.  Patient instructed to not use alcohol or illegal drugs while on prescription medicines. 4.  In the event of worsening symptoms, instructed patient to call 911, the crisis hotline or go to nearest emergency room for evaluation of symptoms.  Signed: Lindwood Qua, NP [facility] 01/01/2016, 10:07 AM

## 2015-12-31 NOTE — Progress Notes (Signed)
Patient ID: Evelyn Alvarez, female   DOB: 1996-04-10, 20 y.o.   MRN: [id number] [facility] MD Progress Note  12/31/2015 3:58 PM Makena Murdock  MRN:  [id number]  Subjective: Patient states " I did not sleep well last night." Pt unable to elaborate on why .   Objective:Ryllie Bocanegra is a 20 y.o. AA female, single , unemployed , has a  hx of schizoaffective disorder ,  patient was admitted with paranoia and agitation .  Lezette is seen, chart reviewed. Discussed patient with treatment team.  Pt today continues to be anxious , has paranoia and irritability on and off although progressing . Pt also reports sleep issues . Per staff pt has been having some impulsivity , is intrusive and needs redirection on the unit .  Pt continues to need encouragement and support.    Principal Problem: Schizoaffective disorder, bipolar type (HCC) Diagnosis:   Patient Active Problem List   Diagnosis Date Noted  . Hyperprolactinemia (HCC) [E22.1] 12/27/2015  . Schizoaffective disorder, bipolar type (HCC) [F25.0] 12/25/2015   Total Time spent with patient: 20 minutes  Past Psychiatric History: Please see H&P.  Past Medical History: Denies hx of HTN, thyroid disease,asthma Past Medical History  Diagnosis Date  . Schizoaffective disorder (HCC)    History reviewed. No pertinent past surgical history. Family History:  Family History  Problem Relation Age of Onset  . Mental illness Neg Hx   . Diabetes Maternal Grandmother   . Alcoholism Maternal Grandmother    Family Psychiatric  History: Pt reports her mother has anger management issues. Her grand mother used to be an alcoholic. Social History: Pt is single , was living at a program at [place] , but currently reports she is homeless.  History  Alcohol Use No     History  Drug Use Not on file    Social History   Social History  . Marital Status: Single    Spouse Name: N/A  . Number of Children: N/A  . Years of Education: N/A   Social History  Main Topics  . Smoking status: Never Smoker   . Smokeless tobacco: None  . Alcohol Use: No  . Drug Use: None  . Sexual Activity: Not Asked   Other Topics Concern  . None   Social History Narrative   Additional Social History:   Sleep: Poor  Appetite:  Fair  Current Medications: Current Facility-Administered Medications  Medication Dose Route Frequency Provider Last Rate Last Dose  . acetaminophen (TYLENOL) tablet 650 mg  650 mg Oral Q6H PRN Charm Rings, NP      . alum & mag hydroxide-simeth (MAALOX/MYLANTA) 200-200-20 MG/5ML suspension 30 mL  30 mL Oral Q4H PRN Charm Rings, NP      . ARIPiprazole (ABILIFY) tablet 5 mg  5 mg Oral QHS Jomarie Longs, MD   5 mg at 12/30/15 2101  . benztropine (COGENTIN) tablet 0.5 mg  0.5 mg Oral QHS Jomarie Longs, MD   0.5 mg at 12/30/15 2101  . hydrOXYzine (ATARAX/VISTARIL) tablet 25 mg  25 mg Oral TID PRN Jomarie Longs, MD   25 mg at 12/28/15 0808  . magnesium hydroxide (MILK OF MAGNESIA) suspension 30 mL  30 mL Oral Daily PRN Charm Rings, NP   30 mL at 12/25/15 1048  . menthol-cetylpyridinium (CEPACOL) lozenge 3 mg  1 lozenge Oral PRN Charm Rings, NP      . OLANZapine zydis (ZYPREXA) disintegrating tablet 10 mg  10 mg Oral TID PRN Karnisha Lefebre,  MD       Or  . OLANZapine (ZYPREXA) injection 10 mg  10 mg Intramuscular TID PRN Jomarie LongsSaramma Feras Gardella, MD      . Melene Muller[START ON 01/04/2016] paliperidone (INVEGA SUSTENNA) injection 117 mg  117 mg Intramuscular Q28 days Jomarie LongsSaramma Shantay Sonn, MD      . traZODone (DESYREL) tablet 100 mg  100 mg Oral QHS Jomarie LongsSaramma Kynzleigh Bandel, MD       Lab Results:  No results found for this or any previous visit (from the past 48 hour(s)). Blood Alcohol level:  Lab Results  Component Value Date   ETH <5 12/22/2015   Physical Findings: AIMS: Facial and Oral Movements Muscles of Facial Expression: None, normal Lips and Perioral Area: None, normal Jaw: None, normal Tongue: None, normal,Extremity Movements Upper (arms, wrists,  hands, fingers): None, normal Lower (legs, knees, ankles, toes): None, normal, Trunk Movements Neck, shoulders, hips: None, normal, Overall Severity Severity of abnormal movements (highest score from questions above): None, normal Incapacitation due to abnormal movements: None, normal Patient's awareness of abnormal movements (rate only patient's report): No Awareness, Dental Status Current problems with teeth and/or dentures?: No Does patient usually wear dentures?: No  CIWA:    COWS:     Musculoskeletal: Strength & Muscle Tone: within normal limits Gait & Station: normal Patient leans: N/A  Psychiatric Specialty Exam: Review of Systems  Psychiatric/Behavioral: Negative for suicidal ideas. The patient is nervous/anxious and has insomnia.   All other systems reviewed and are negative.   Blood pressure 117/63, pulse 109, temperature 98.4 F (36.9 C), temperature source Oral, resp. rate 16, height 5\' 6"  (1.676 m), weight 114.08 kg (251 lb 8 oz), SpO2 100 %.Body mass index is 40.61 kg/(m^2).  General Appearance: Fairly Groomed  Patent attorneyye Contact::  Fair  Speech:  Slow  Volume:  Normal  Mood:  Anxious  Affect:  Congruent  Thought Process: Linear  Orientation:  Full (Time, Place, and Person)  Thought Content:  paranoid  Suicidal Thoughts:  No   Homicidal Thoughts:  No  Memory:  Immediate;   Fair Recent;   Fair Remote;   Fair  Judgement:  Impaired  Insight:  Shallow  Psychomotor Activity:  Restlessness  Concentration:  Fair  Recall:  FiservFair  Fund of Knowledge:Fair  Language: Fair  Akathisia:  No  Handed:  Right  AIMS (if indicated):     Assets:  Desire for Improvement  ADL's:  Intact  Cognition: WNL  Sleep:  Number of Hours: 4.25   12/26/15  Reviewed records from [facility] - Pt presented as catatonic , disorganized on Nov 18, 2015 and was discharged on march 14 , 2017. Pt was on Forced medication at [facility] and she received Invega sustenna IM 234 mg on March 2 , 2017,and  156 mg on March 9 the 2017. She is due for Invega sustenna IM 117 mg on march 31 st . Pt was advised to continue Invega er 6 mg po daily for 15 days after discharge.   Treatment Plan Summary: Laveda Normanikki Klepper is a 20 y.o. AA female, single , unemployed , has a  hx of schizoaffective disorder ,  patient admitted with paranoia and agitation. Pt today with sleep issues. Will continue treatment.  Daily contact with patient to assess and evaluate symptoms and progress in treatment and Medication management  Will continue Invega Sustenna IM 117 mg q28 days - next dose on 01/04/16. Continue  Abilify 5 mg po qhs to augment the effect of Invega sustenna IM  as well as for elevated PL level. Will continue continue Cogentin 0.5 mg at bedtime for EPS. Will increase Trazodone to 100 mg po qhs for sleep. Will make available PRN medications as per agitation protocol. Will continue to monitor vitals, medication compliance and treatment side effects while patient is here.  Will monitor for medical issues as well as call consult as needed. CSW will continue working on disposition. Pt was planned to be discharged with grand mother today is possible and pt was agreeable with that. However , grandmother did not return phone calls.Pt to be discharged to [facility] with case manager tomorrow. No bed available today. Patient to participate in therapeutic milieu .  Arianis Bowditch, MD    12/31/2015, 3:58 PM

## 2015-12-31 NOTE — BHH Suicide Risk Assessment (Addendum)
[facility]   Principal Problem: Schizoaffective disorder, bipolar type (HCC) acute phase resolved Discharge Diagnoses:  Patient Active Problem List   Diagnosis Date Noted  . Hyperprolactinemia (HCC) [E22.1] 12/27/2015  . Schizoaffective disorder, bipolar type (HCC) [F25.0] 12/25/2015    Total Time spent with patient: 30 minutes  Musculoskeletal: Strength & Muscle Tone: within normal limits Gait & Station: normal Patient leans: N/A  Psychiatric Specialty Exam: Review of Systems  Psychiatric/Behavioral: Negative for depression, suicidal ideas and hallucinations. The patient is not nervous/anxious.   All other systems reviewed and are negative.   Blood pressure 117/63, pulse 109, temperature 98.4 F (36.9 C), temperature source Oral, resp. rate 16, height 5\' 6"  (1.676 m), weight 114.08 kg (251 lb 8 oz), SpO2 100 %.Body mass index is 40.61 kg/(m^2).  General Appearance: Casual  Eye Contact::  Fair  Speech:  Clear and Coherent409  Volume:  Normal  Mood:  Euthymic  Affect:  Appropriate  Thought Process:  Coherent  Orientation:  Full (Time, Place, and Person)  Thought Content:  WDL  Suicidal Thoughts:  No  Homicidal Thoughts:  No  Memory:  Immediate;   Fair Recent;   Fair Remote;   Fair  Judgement:  Fair  Insight:  Fair  Psychomotor Activity:  Normal  Concentration:  Fair  Recall:  FiservFair  Fund of Knowledge:Fair  Language: Fair  Akathisia:  No  Handed:  Right  AIMS (if indicated):   0  Assets:  Desire for Improvement  Sleep:  Number of Hours: 4.25  Cognition: WNL  ADL's:  Intact   Mental Status Per Nursing Assessment::   On Admission:     Demographic Factors:  NA  Loss Factors: NA  Historical Factors: Impulsivity  Risk Reduction Factors:   Living with another person, especially a relative and Positive therapeutic relationship  Continued Clinical Symptoms:  Previous Psychiatric Diagnoses and Treatments  Cognitive Features That  Contribute To Risk:  Polarized thinking    Suicide Risk:  Minimal: No identifiable suicidal ideation.  Patients presenting with no risk factors but with morbid ruminations; may be classified as minimal risk based on the severity of the depressive symptoms  Follow-up Information    [facility]   Why:  Referral made to this provider for ACT services   Contact information:   [address]  [place], [place] [address]        Phone: [phone]  Fax: [phone]        [facility]   Why:  Patient applying for admission to this structured living environment.   Contact information:   [address]  [place], [place] [address] Phone [phone]   Fax [phone]       Plan Of Care/Follow-up recommendations:  Activity:  no restrictions Diet:  regular Tests:  Follow up on Prolactin level Other:  Invega sustenna IM 117 mg - due on 3/12/05/15- repeat every 28 days. Pt is also on Abilify PO along with Invega IM .  Daivd Fredericksen, MD 01/01/2016, 9:17 AM

## 2015-12-31 NOTE — Progress Notes (Signed)
DAR NOTE: Patient affect is flat and mood is depressed.  Denies pain, auditory and visual hallucinations.  Rates depression at 0, hopelessness at 0, and anxiety at 0.  Maintained on routine safety checks.  Medications given as prescribed.  Support and encouragement offered as needed.  Attended group and participated.  States goal for today is "compassion."  Patient stayed in her room most of the shift.  Minimal interaction with staff and peers.  Patient preoccupied with discharged.  Discharge reschedule till tomorow due to placement arrangement.

## 2016-01-01 NOTE — BHH Group Notes (Signed)
BHH Group Notes:  (Nursing/MHT/Case Management/Adjunct)  Date:  01/01/2016  Time:  11:00 AM  Type of Therapy:  Nurse Education  Participation Level:  Active  Participation Quality:  Appropriate and Attentive  Affect:  Appropriate  Cognitive:  Alert and Appropriate  Insight:  Appropriate and Good  Engagement in Group:  Engaged  Modes of Intervention:  Discussion and Education     Summary of Progress/Problems: Topic was on leisure and lifestyle changes. Discussed the importance of choosing a healthy leisure activities. Group encouraged to surround themselves with positive and healthy group/support system when changing to a healthy lifestyle. Patient was receptive and contributed.   Evelyn Alvarez 01/01/2016, 11:00 AM

## 2016-01-01 NOTE — Progress Notes (Signed)
Patient discharged home with prescriptions. Patient was stable and appreciative at that time. All papers and prescriptions were given and valuables returned. Verbal understanding expressed. Denies SI/HI and A/VH. Patient given opportunity to express concerns and ask questions.  

## 2016-01-01 NOTE — Progress Notes (Signed)
  [facility] :  Will you be returning to the same living situation after discharge:  No. At discharge, do you have transportation home?: Yes,  bus ticket, train ticket Do you have the ability to pay for your medications: Yes,  MCD  Release of information consent forms completed and in the chart;  Patient's signature needed at discharge.  Patient to Follow up at: Follow-up Information    [facility]   Why:  Call Selena BattenKim when you [place] in [place] and confirm where you are. [phone].  [facility]] is right across from the train station at [facility]   Contact information:   [address]  [place], [place] [address]        Phone: [phone]  Fax: [phone]        Schedule an appointment as soon as possible for a visit with [facility] has several locations in [place].  Choose location closest to where you will be staying at..   Why:  to establish primary care      Next level of care provider has access to [facility] Link:no  Safety Planning and Suicide Prevention discussed: Yes,  yes     Has patient been referred to the Quitline?: N/A patient is not a smoker  Patient has been referred for addiction treatment: N/A  Ida Rogueorth, Delmont Prosch B 01/01/2016, 10:17 AM

## 2016-01-01 NOTE — Progress Notes (Signed)
Patient ID: Evelyn Alvarez, female   DOB: 1996-08-14, 20 y.o.   MRN: [id number] D: Patient denies SI/HI/AVH and pain. Pt disappointed but calm about not discharging yesterday. Pt reports tolerating medication well. Pt observed watching TV and interacting well with peers. No behavioral issues noted.  A: Support and encouragement offered as needed. Medications administered as prescribed.  R: Patient cooperative and appropriate on unit. Will continue to monitor patient for safety and stability.

## 2016-01-01 NOTE — BHH Suicide Risk Assessment (Signed)
BHH INPATIENT:  Family/Significant Other Suicide Prevention Education  Suicide Prevention Education:  Education Completed; No one  has been identified by the patient as the family member/significant other with whom the patient will be residing, and identified as the person(s) who will aid the patient in the event of a mental health crisis (suicidal ideations/suicide attempt).  With written consent from the patient, the family member/significant other has been provided the following suicide prevention education, prior to the and/or following the discharge of the patient.  The suicide prevention education provided includes the following:  Suicide risk factors  Suicide prevention and interventions  National Suicide Hotline telephone number  [facility] assessment telephone number  [facility] Emergency Assistance 911  [facility] and/or Residential Mobile Crisis Unit telephone number  Request made of family/significant other to:  Remove weapons (e.g., guns, rifles, knives), all items previously/currently identified as safety concern.    Remove drugs/medications (over-the-counter, prescriptions, illicit drugs), all items previously/currently identified as a safety concern.  The family member/significant other verbalizes understanding of the suicide prevention education information provided.  The family member/significant other agrees to remove the items of safety concern listed above. The patient did not endorse SI at the time of admission, nor did the patient c/o SI during the stay here.  SPE not required.   Evelyn Geraldorth, Evelyn Alvarez B 01/01/2016, 10:23 AM

## 2016-01-01 NOTE — Tx Team (Signed)
Interdisciplinary Treatment Plan Update (Adult)  Date: TD@  Time Reviewed: NOW@   Progress in Treatment: Attending groups: Yes. Participating in groups:  Yes. Taking medication as prescribed:  Yes. Tolerating medication:  Yes. Family/Significant othe contact made: Yes Patient understands diagnosis:  Yes. Discussing patient identified problems/goals with staff:  Yes. Medical problems stabilized or resolved:  Yes. Denies suicidal/homicidal ideation: Yes. Issues/concerns per patient self-inventory:  Other:  New problem(s) identified:    Discharge Plan or Barriers: see below  Reason for Continuation of Hospitalization:   Comments:    Estimated length of stay:  D/C today  New goal(s):   Additional Comments:  Patient and CSW reviewed pt's identified goals and treatment plan. Patient verbalized understanding and agreed to treatment plan. CSW reviewed [facility] "Discharge Process and Patient Involvement" Form. Pt verbalized understanding of information provided and signed form  Patient has a long history of psychiatric hospitalizations -was discharged from [facility] to [facility], left after 5 months, became homeless in [place] area.  After release from [facility], became homeless in [place].  Was hospitalized [facility] at [facility] after becoming homeless.  Has [employer], family supports in [facility].  Care coordinator recommends ACT team in [facility], [place], shelter placement if natural supports are unavailable.  Patient's long term goal is return to [facility].    Review of initial/current patient goals per problem list:    1.  Goal(s): Patient will participate in aftercare plan  Met:  Yes  Target date: at discharge  As evidenced by: Patient will participate within aftercare plan AEB aftercare provider and housing plan at discharge being identified. 3/24:  Patient referred to [facility] in [place], assessing natural  supports and availability of shelter beds in [facility].  Goal progressing.  Edwyna Shell, LCSW 01/01/16:  Will travel to [facility] via train today, follow up with ACT team  2.  Goal (s): Patient will exhibit decreased depressive symptoms and suicidal ideations.  Met:  Yes   Target date: at discharge  As evidenced by: Patient will utilize self rating of depression at 3 or below and demonstrate decreased signs of depression or be deemed stable for discharge by MD.  3/24:  Patient presents w flat affect, tearful in group,  and rates depression as "high",denies SI.   goal progressing.  Edwyna Shell, LCSW  01/01/16:  Denies depression today  3.  Goal(s): Patient will demonstrate decreased signs and symptoms of anxiety.  Met: Yes  Target date: at discharge  As evidenced by: Patient will utilize self rating of anxiety at 3 or below and demonstrated decreased signs of anxiety, or be deemed stable for discharge by MD  3/24:  Patient reports rapid heart rate, subjective feeling of anxiety, rates anxiety as "high", goal not met.  Edwyna Shell, LCSW  01/01/16:  Rates anxiety at a 2 today    Attendees: Patient:   01/01/2016 10:24 AM   Family:   01/01/2016 10:24 AM   Physician:  Ursula Alert, MD 01/01/2016 10:24 AM   Nursing:  Sharl Ma white  01/01/2016 10:24 AM   Clinical Social Worker: Ripley Fraise  01/01/2016 10:24 AM   Clinical Social Worker:  01/01/2016 10:24 AM   Other:  Gerline Legacy Nurse Case Manager 01/01/2016 10:24 AM   Other:   01/01/2016 10:24 AM   Other:   01/01/2016 10:24 AM   Other:  01/01/2016 10:24 AM   Other:  01/01/2016 10:24 AM   Other:  01/01/2016 10:24 AM  01/01/2016 10:24 AM    01/01/2016 10:24 AM    01/01/2016 10:24 AM    01/01/2016 10:24 AM    Scribe for Treatment Team:   Ripley Fraise  01/01/2016 10:24 AM
